# Patient Record
Sex: Female | Born: 1945
Health system: Southern US, Community
[De-identification: ages and names within clinical notes are randomized; demographics above are authoritative.]

## PROBLEM LIST (undated history)

## (undated) DIAGNOSIS — I10 Essential (primary) hypertension: Secondary | ICD-10-CM

## (undated) DIAGNOSIS — S42301A Unspecified fracture of shaft of humerus, right arm, initial encounter for closed fracture: Secondary | ICD-10-CM

## (undated) DIAGNOSIS — M1990 Unspecified osteoarthritis, unspecified site: Secondary | ICD-10-CM

## (undated) DIAGNOSIS — R339 Retention of urine, unspecified: Secondary | ICD-10-CM

## (undated) DIAGNOSIS — C801 Malignant (primary) neoplasm, unspecified: Secondary | ICD-10-CM

## (undated) HISTORY — PX: EYE SURGERY: SHX253

## (undated) HISTORY — PX: TUBAL LIGATION: SHX77

---

## 1997-09-26 ENCOUNTER — Encounter: Admission: RE | Admit: 1997-09-26 | Discharge: 1997-12-25 | Payer: Self-pay | Admitting: Family Medicine

## 1998-05-09 ENCOUNTER — Ambulatory Visit (HOSPITAL_COMMUNITY): Admission: RE | Admit: 1998-05-09 | Discharge: 1998-05-09 | Payer: Self-pay | Admitting: Specialist

## 1998-05-09 ENCOUNTER — Encounter: Payer: Self-pay | Admitting: Specialist

## 1998-05-30 ENCOUNTER — Ambulatory Visit (HOSPITAL_COMMUNITY): Admission: RE | Admit: 1998-05-30 | Discharge: 1998-05-30 | Payer: Self-pay | Admitting: Obstetrics and Gynecology

## 1999-06-13 ENCOUNTER — Other Ambulatory Visit: Admission: RE | Admit: 1999-06-13 | Discharge: 1999-06-13 | Payer: Self-pay | Admitting: Obstetrics and Gynecology

## 2000-06-02 ENCOUNTER — Other Ambulatory Visit: Admission: RE | Admit: 2000-06-02 | Discharge: 2000-06-02 | Payer: Self-pay | Admitting: Obstetrics and Gynecology

## 2001-07-13 ENCOUNTER — Other Ambulatory Visit: Admission: RE | Admit: 2001-07-13 | Discharge: 2001-07-13 | Payer: Self-pay | Admitting: Obstetrics and Gynecology

## 2010-07-02 ENCOUNTER — Other Ambulatory Visit: Payer: Self-pay | Admitting: Family Medicine

## 2010-07-02 ENCOUNTER — Other Ambulatory Visit (HOSPITAL_COMMUNITY)
Admission: RE | Admit: 2010-07-02 | Discharge: 2010-07-02 | Disposition: A | Discharge status: Home / Self Care | Payer: 59 | Source: Ambulatory Visit | Attending: Family Medicine | Admitting: Family Medicine

## 2010-07-02 DIAGNOSIS — Z1159 Encounter for screening for other viral diseases: Secondary | ICD-10-CM | POA: Insufficient documentation

## 2010-07-02 DIAGNOSIS — Z124 Encounter for screening for malignant neoplasm of cervix: Secondary | ICD-10-CM | POA: Insufficient documentation

## 2015-12-26 DIAGNOSIS — Z1231 Encounter for screening mammogram for malignant neoplasm of breast: Secondary | ICD-10-CM | POA: Diagnosis not present

## 2015-12-26 DIAGNOSIS — M8589 Other specified disorders of bone density and structure, multiple sites: Secondary | ICD-10-CM | POA: Diagnosis not present

## 2016-02-04 DIAGNOSIS — R69 Illness, unspecified: Secondary | ICD-10-CM | POA: Diagnosis not present

## 2016-02-21 DIAGNOSIS — Z23 Encounter for immunization: Secondary | ICD-10-CM | POA: Diagnosis not present

## 2016-02-21 DIAGNOSIS — Z1159 Encounter for screening for other viral diseases: Secondary | ICD-10-CM | POA: Diagnosis not present

## 2016-02-21 DIAGNOSIS — M8580 Other specified disorders of bone density and structure, unspecified site: Secondary | ICD-10-CM | POA: Diagnosis not present

## 2016-02-21 DIAGNOSIS — E6609 Other obesity due to excess calories: Secondary | ICD-10-CM | POA: Diagnosis not present

## 2016-02-21 DIAGNOSIS — E559 Vitamin D deficiency, unspecified: Secondary | ICD-10-CM | POA: Diagnosis not present

## 2016-02-21 DIAGNOSIS — Z1211 Encounter for screening for malignant neoplasm of colon: Secondary | ICD-10-CM | POA: Diagnosis not present

## 2016-02-21 DIAGNOSIS — I10 Essential (primary) hypertension: Secondary | ICD-10-CM | POA: Diagnosis not present

## 2016-02-21 DIAGNOSIS — Z6834 Body mass index (BMI) 34.0-34.9, adult: Secondary | ICD-10-CM | POA: Diagnosis not present

## 2016-02-21 DIAGNOSIS — Z0000 Encounter for general adult medical examination without abnormal findings: Secondary | ICD-10-CM | POA: Diagnosis not present

## 2016-08-07 ENCOUNTER — Telehealth (INDEPENDENT_AMBULATORY_CARE_PROVIDER_SITE_OTHER): Payer: Self-pay | Admitting: Orthopaedic Surgery

## 2016-08-07 NOTE — Telephone Encounter (Signed)
FAXED Lupus

## 2016-08-25 ENCOUNTER — Ambulatory Visit (INDEPENDENT_AMBULATORY_CARE_PROVIDER_SITE_OTHER): Payer: Medicare HMO | Admitting: Orthopaedic Surgery

## 2016-08-25 DIAGNOSIS — M25561 Pain in right knee: Secondary | ICD-10-CM

## 2016-08-25 DIAGNOSIS — S83231A Complex tear of medial meniscus, current injury, right knee, initial encounter: Secondary | ICD-10-CM | POA: Diagnosis not present

## 2016-08-25 NOTE — Progress Notes (Signed)
Office Visit Note   Patient: Lindsay Peck           Date of Birth: 12-24-1945           MRN: [id number] Visit Date: 08/25/2016              Requested by: No referring provider defined for this encounter. PCP: Tawanna Solo, MD   Assessment & Plan: Visit Diagnoses:  1. Acute pain of right knee   2. Complex tear of medial meniscus of right knee as current injury, initial encounter     Plan: I feel that she would benefit from a right knee arthroscopy with a partial medial meniscectomy. The main reason is that she is having symptomatic locking catching from this large horizontal tear of the meniscus. Also she has intact cartilage throughout the medial compartment. She is 71 years old she understands that certainly arthritic changes can be accelerated when she loosened review shock absorbing she is now having enough symptoms is bothering her. I think he would be quite some time before she would potentially increase her arthritic changes on the medial compartment and for now since she has a large joint effusion and minimal cartilage changes in that knee in spite of the large meniscal tear I do feel that a meniscal debridement/partial medial meniscectomy is warranted. I showed her knee model and went over the MRI report and described in detail what the surgery involves including a thorough discussion of risk medicine surgery. She does wish proceed with this. She will stability active this summer and has a trip this fall planned to [place]. We would see her back at one week postoperative rehabilitation her knee after that. All questions were encouraged and answered. Follow-Up Instructions: Return for 1 week post-op.   Orders:  No orders of the defined types were placed in this encounter.  No orders of the defined types were placed in this encounter.     Procedures: No procedures performed   Clinical Data: No additional findings.   Subjective: No chief complaint on file. The  patient comes in today for second opinion regarding her right knee. She's been seen at [facility] and has an MRI report showing an acute horizontal tear of the medial meniscus. She 71 year old who is very active. According to the patient she was told she should just watch this because if she had a meniscectomy it would likely lead to severe arthritic changes and she would need a knee replacement and less than year. Again this according to the patient. Certainly I explained to her that losing cervical meniscus does increased arthritic changes of the knee. She does report locking catching in her knee and medial joint line tenderness as well as swelling in her knee. She said this is detrimentally affected her activities daily living, her quality of life, and her mobility. She is seeking out a second opinion in regards to her knee. She would like to consider arthroscopic intervention.  HPI  Review of Systems She currently denies any chest pain, headache, nausea, vomiting, fever, chills.  Objective: Vital Signs: There were no vitals taken for this visit.  Physical Exam She is alert and oriented 3 in no acute distress. She is walking without assistive device. Ortho Exam Examination of her right knee shows a moderate effusion. She has medial joint line tenderness as well as a positive worse on the medial side. She has significant pain in her knee once I flexor past 90 of flexion. The knee itself feels  ligamentously stable. There is some slight patellofemoral crepitation. Specialty Comments:  No specialty comments available.  Imaging: No results found. The MRI confirms a large horizontal medial meniscal tear with intact cartilage in the medial compartment of her knee. There is a moderate effusion as well.  PMFS History: Patient Active Problem List   Diagnosis Date Noted  . Acute pain of right knee 08/25/2016  . Complex tear of medial meniscus of right knee as current injury 08/25/2016     No past medical history on file.  No family history on file.  No past surgical history on file. Social History   Occupational History  . Not on file.   Social History Main Topics  . Smoking status: Not on file  . Smokeless tobacco: Not on file  . Alcohol use Not on file  . Drug use: Unknown  . Sexual activity: Not on file

## 2016-09-11 DIAGNOSIS — S83241A Other tear of medial meniscus, current injury, right knee, initial encounter: Secondary | ICD-10-CM | POA: Diagnosis not present

## 2016-09-18 ENCOUNTER — Ambulatory Visit (INDEPENDENT_AMBULATORY_CARE_PROVIDER_SITE_OTHER): Payer: Medicare HMO | Admitting: Physician Assistant

## 2016-09-18 ENCOUNTER — Encounter (INDEPENDENT_AMBULATORY_CARE_PROVIDER_SITE_OTHER): Payer: Self-pay | Admitting: Physician Assistant

## 2016-09-18 DIAGNOSIS — Z9889 Other specified postprocedural states: Secondary | ICD-10-CM

## 2016-09-18 NOTE — Progress Notes (Signed)
Mrs. Hunkele returns today status post right knee arthroscopy with medial partial meniscectomy and debridement. Knee arthroscopy showed significant medial femoral condyle and tibial plateau arthritis with multiple areas of grade 4 changes femoral condyle. She overall is doing well states she's having no real pain. She's having no mechanical symptoms in the knee.  Physical exam: General well-developed well-nourished female in no acute distress. Right knee port sites well possibly with interrupted nylon sutures. Calf supple nontender she has full extension and flexion to approximately 105 110.  Plan: Sutures removed. Scar tissue mobilization over the port sites encouraged. We'll see her back in a month check progress lack of. She may benefit from a supplemental injection in her knee if she has considerable pain. She did like to stay away from NSAIDs and take natural anti-inflammatories. Discussed dried cherries and Tumeric with her.

## 2016-09-24 ENCOUNTER — Ambulatory Visit (INDEPENDENT_AMBULATORY_CARE_PROVIDER_SITE_OTHER): Payer: Medicare HMO | Admitting: Orthopaedic Surgery

## 2016-09-25 ENCOUNTER — Inpatient Hospital Stay (INDEPENDENT_AMBULATORY_CARE_PROVIDER_SITE_OTHER): Payer: Medicare HMO | Admitting: Orthopaedic Surgery

## 2016-10-20 ENCOUNTER — Ambulatory Visit (INDEPENDENT_AMBULATORY_CARE_PROVIDER_SITE_OTHER): Payer: Medicare HMO | Admitting: Physician Assistant

## 2016-10-22 ENCOUNTER — Ambulatory Visit (INDEPENDENT_AMBULATORY_CARE_PROVIDER_SITE_OTHER): Payer: Medicare HMO | Admitting: Orthopaedic Surgery

## 2016-10-22 DIAGNOSIS — S83231D Complex tear of medial meniscus, current injury, right knee, subsequent encounter: Secondary | ICD-10-CM

## 2016-10-22 DIAGNOSIS — Z9889 Other specified postprocedural states: Secondary | ICD-10-CM

## 2016-10-22 NOTE — Progress Notes (Signed)
The patient is now 6 weeks status post a right knee arthroscopy with a partial medial meniscectomy. We did find grade 4 cartilage changes in the medial femoral condyle. She says her pain now is only about 1 out of 10. She said her strength and range of motion are increasing and she is going to therapy for her knee as well in terms of personal trainer. She is getting stronger.  On examination is no effusion of her right knee. She has excellent range of motion of the knee with minimal discomfort tenderness. Her incisions are well-healed. Her quad strength is good.  At this point she'll follow-up as needed. All questions were encouraged and answered. Also recommended she try Tumeric. I talked her about trying a hyaluronic acid injection or another steroid injection in the future if needed.

## 2016-11-04 ENCOUNTER — Other Ambulatory Visit (INDEPENDENT_AMBULATORY_CARE_PROVIDER_SITE_OTHER): Payer: Self-pay | Admitting: Orthopaedic Surgery

## 2019-05-09 ENCOUNTER — Encounter: Payer: Self-pay | Admitting: Orthopaedic Surgery

## 2019-05-09 ENCOUNTER — Ambulatory Visit: Payer: Medicare HMO | Admitting: Orthopaedic Surgery

## 2019-05-09 ENCOUNTER — Other Ambulatory Visit: Payer: Self-pay

## 2019-05-09 ENCOUNTER — Ambulatory Visit (INDEPENDENT_AMBULATORY_CARE_PROVIDER_SITE_OTHER): Payer: Medicare HMO

## 2019-05-09 DIAGNOSIS — M25571 Pain in right ankle and joints of right foot: Secondary | ICD-10-CM

## 2019-05-09 MED ORDER — MELOXICAM 15 MG PO TABS: 15.0000 mg | ORAL_TABLET | Freq: Every day | ORAL | 1 refills | Status: DC

## 2019-05-09 NOTE — Progress Notes (Signed)
Office Visit Note   Patient: Lindsay Peck           Date of Birth: 12/23/1945           MRN: [id number] Visit Date: 05/09/2019              Requested by: Kathyrn Lass, [place],  [place] [id number] PCP: Kathyrn Lass, MD   Assessment & Plan: Visit Diagnoses:  1. Pain in right ankle and joints of right foot     Plan: I gave her reassurance that I do not see any fracture.  I would like her to try an ASO for the next 3 to 4 weeks.  Also going to try meloxicam and topical Voltaren gel on her ankle.  All question concerns were answered addressed.  Follow-up can be as needed.  However after 3 weeks if she still having pain she will call us because a MRI would be warranted of the right ankle to rule out a stress fracture.  Follow-Up Instructions: Return if symptoms worsen or fail to improve.   Orders:  Orders Placed This Encounter  Procedures  . XR Ankle Complete Right   Meds ordered this encounter  Medications  . meloxicam (MOBIC) 15 MG tablet    Sig: Take 1 tablet (15 mg total) by mouth daily.    Dispense:  30 tablet    Refill:  1      Procedures: No procedures performed   Clinical Data: No additional findings.   Subjective: Chief Complaint  Patient presents with  . Right Ankle - Pain  The patient comes in with chief complaint of right ankle pain for about 3 weeks.  There is been no new injury.  She is 74 years old and does have a history of fractures and osteopenia.  She says her range of motion is painful and she has decreased strength.  She has been taking calcium.  She points to the front of the ankle as a source of her pain.  She says it hurts with pivoting activities as well.  She denies any numbness and tingling.  HPI  Review of Systems She currently denies any acute change in medical status.  She denies any headache, chest pain, shortness of breath, fever, chills, nausea, vomiting  Objective: Vital Signs: There were no vitals taken for  this visit.  Physical Exam She is alert and orient x3 and in no acute distress Ortho Exam Examination of her right ankle shows pain with dorsiflexion plantarflexion across the front of her ankle but otherwise her ankle exam is normal.  The range of motion is full.  Her arch feels normal.  There is no significant joint effusion at the ankle.  She has palpable pulses in her foot and normal sensation across the foot. Specialty Comments:  No specialty comments available.  Imaging: XR Ankle Complete Right  Result Date: 05/09/2019 3 views of the right ankle show no acute findings.  The joint space is well-maintained.  There is no evidence of fracture.    PMFS History: Patient Active Problem List   Diagnosis Date Noted  . Status post arthroscopy of right knee 10/22/2016  . Acute pain of right knee 08/25/2016  . Complex tear of medial meniscus of right knee as current injury 08/25/2016   History reviewed. No pertinent past medical history.  History reviewed. No pertinent family history.  History reviewed. No pertinent surgical history. Social History   Occupational History  . Not on file  Tobacco Use  . Smoking status: Never Smoker  . Smokeless tobacco: Never Used  Substance and Sexual Activity  . Alcohol use: Not on file  . Drug use: Not on file  . Sexual activity: Not on file

## 2019-05-22 ENCOUNTER — Ambulatory Visit: Payer: Medicare HMO | Attending: Internal Medicine

## 2019-05-22 DIAGNOSIS — Z23 Encounter for immunization: Secondary | ICD-10-CM | POA: Insufficient documentation

## 2019-05-27 ENCOUNTER — Other Ambulatory Visit: Payer: Self-pay | Admitting: Urology

## 2019-05-28 ENCOUNTER — Ambulatory Visit: Payer: Medicare HMO

## 2019-06-02 ENCOUNTER — Ambulatory Visit: Payer: Medicare HMO

## 2019-06-08 ENCOUNTER — Ambulatory Visit: Payer: Medicare HMO

## 2019-06-19 ENCOUNTER — Ambulatory Visit: Payer: Medicare HMO | Attending: Internal Medicine

## 2019-06-19 DIAGNOSIS — Z23 Encounter for immunization: Secondary | ICD-10-CM | POA: Insufficient documentation

## 2019-06-19 NOTE — Progress Notes (Signed)
   Covid-19 Vaccination Clinic  Name:  Lindsay Peck    MRN: [id number] DOB: July 05, 1945  06/19/2019  [name] was observed post Covid-19 immunization for 15 minutes without incidence. She was provided with Vaccine Information Sheet and instruction to access the V-Safe system.   [name] was instructed to call 911 with any severe reactions post vaccine: Marland Kitchen Difficulty breathing  . Swelling of your face and throat  . A fast heartbeat  . A bad rash all over your body  . Dizziness and weakness    Immunizations Administered    Name Date Dose VIS Date Route   Moderna COVID-19 Vaccine 06/19/2019  5:03 PM 0.5 mL 03/29/2019 Intramuscular   Manufacturer: Moderna   Lot: [id number]   [place]:[id number]

## 2019-07-07 NOTE — Patient Instructions (Addendum)
DUE TO COVID-19 ONLY ONE VISITOR IS ALLOWED TO COME WITH YOU AND STAY IN THE WAITING ROOM ONLY DURING PRE OP AND PROCEDURE DAY OF SURGERY. THE 1 VISITOR MAY VISIT WITH YOU AFTER SURGERY IN YOUR PRIVATE ROOM DURING VISITING HOURS ONLY!  YOU NEED TO HAVE A COVID 19 TEST ON 07-12-19 @ 2:10 PM, THIS TEST MUST BE DONE BEFORE SURGERY, COME  [place], [place] [address] , [address].  ([place]) ONCE YOUR COVID TEST IS COMPLETED, PLEASE BEGIN THE QUARANTINE INSTRUCTIONS AS OUTLINED IN YOUR HANDOUT.                ISABELE FAM  07/07/2019   Your procedure is scheduled on: 07-15-19   Report to [facility] Main  Entrance    Report to Admitting at 5:30 AM     Call this number if you have problems the morning of surgery [phone]    Remember: Do not eat food or drink liquids :After Midnight.     Take these medicines the morning of surgery with A SIP OF WATER: Atorvastatin    BRUSH YOUR TEETH MORNING OF SURGERY AND RINSE YOUR MOUTH OUT, NO CHEWING GUM CANDY OR MINTS.                                You may not have any metal on your body including hair pins and              piercings     Do not wear jewelry, make-up, lotions, powders or perfumes, deodorant              Do not wear nail polish on your fingernails.  Do not shave  48 hours prior to surgery.         Do not bring valuables to the hospital. [place].  Contacts, dentures or bridgework may not be worn into surgery.  You may bring an overnight bag     Special Instructions: N/A              Please read over the following fact sheets you were given: _____________________________________________________________________             [facility] - Preparing for Surgery Before surgery, you can play an important role.  Because skin is not sterile, your skin needs to be as free of germs as possible.  You can reduce the number of germs on your skin by  washing with CHG (chlorahexidine gluconate) soap before surgery.  CHG is an antiseptic cleaner which kills germs and bonds with the skin to continue killing germs even after washing. Please DO NOT use if you have an allergy to CHG or antibacterial soaps.  If your skin becomes reddened/irritated stop using the CHG and inform your nurse when you arrive at Short Stay. Do not shave (including legs and underarms) for at least 48 hours prior to the first CHG shower.  You may shave your face/neck. Please follow these instructions carefully:  1.  Shower with CHG Soap the night before surgery and the  morning of Surgery.  2.  If you choose to wash your hair, wash your hair first as usual with your  normal  shampoo.  3.  After you shampoo, rinse your hair and body thoroughly to remove the  shampoo.  4.  Use CHG as you would any other liquid soap.  You can apply chg directly  to the skin and wash                       Gently with a scrungie or clean washcloth.  5.  Apply the CHG Soap to your body ONLY FROM THE NECK DOWN.   Do not use on face/ open                           Wound or open sores. Avoid contact with eyes, ears mouth and genitals (private parts).                       Wash face,  Genitals (private parts) with your normal soap.             6.  Wash thoroughly, paying special attention to the area where your surgery  will be performed.  7.  Thoroughly rinse your body with warm water from the neck down.  8.  DO NOT shower/wash with your normal soap after using and rinsing off  the CHG Soap.                9.  Pat yourself dry with a clean towel.            10.  Wear clean pajamas.            11.  Place clean sheets on your bed the night of your first shower and do not  sleep with pets. Day of Surgery : Do not apply any lotions/deodorants the morning of surgery.  Please wear clean clothes to the hospital/surgery center.  FAILURE TO FOLLOW THESE INSTRUCTIONS MAY RESULT IN THE  CANCELLATION OF YOUR SURGERY PATIENT SIGNATURE_________________________________  NURSE SIGNATURE__________________________________  ________________________________________________________________________

## 2019-07-07 NOTE — Progress Notes (Signed)
PCP - Kathyrn Lass, MD Cardiologist -   Chest x-ray -  EKG -  Stress Test -  ECHO -  Cardiac Cath -   Sleep Study -  CPAP -   Fasting Blood Sugar -  Checks Blood Sugar _____ times a day  Blood Thinner Instructions: Aspirin Instructions: Last Dose:  Anesthesia review:   Patient denies shortness of breath, fever, cough and chest pain at PAT appointment   Patient verbalized understanding of instructions that were given to them at the PAT appointment. Patient was also instructed that they will need to review over the PAT instructions again at home before surgery.

## 2019-07-08 ENCOUNTER — Encounter (HOSPITAL_COMMUNITY)
Admission: RE | Admit: 2019-07-08 | Discharge: 2019-07-08 | Disposition: A | Discharge status: Home / Self Care | Payer: Medicare HMO | Source: Ambulatory Visit | Attending: Urology | Admitting: Urology

## 2019-07-08 ENCOUNTER — Encounter (HOSPITAL_COMMUNITY): Payer: Self-pay | Admitting: *Deleted

## 2019-07-08 ENCOUNTER — Other Ambulatory Visit: Payer: Self-pay

## 2019-07-08 HISTORY — DX: Malignant (primary) neoplasm, unspecified: C80.1

## 2019-07-08 HISTORY — DX: Essential (primary) hypertension: I10

## 2019-07-08 HISTORY — DX: Unspecified osteoarthritis, unspecified site: M19.90

## 2019-07-11 ENCOUNTER — Other Ambulatory Visit: Payer: Self-pay

## 2019-07-11 ENCOUNTER — Encounter (HOSPITAL_COMMUNITY)
Admission: RE | Admit: 2019-07-11 | Discharge: 2019-07-11 | Disposition: A | Discharge status: Home / Self Care | Payer: Medicare HMO | Source: Ambulatory Visit | Attending: Urology | Admitting: Urology

## 2019-07-11 DIAGNOSIS — Z01818 Encounter for other preprocedural examination: Secondary | ICD-10-CM | POA: Diagnosis present

## 2019-07-11 DIAGNOSIS — I10 Essential (primary) hypertension: Secondary | ICD-10-CM | POA: Insufficient documentation

## 2019-07-11 LAB — COMPREHENSIVE METABOLIC PANEL
ALT: 16 U/L (ref 0–44)
AST: 19 U/L (ref 15–41)
Albumin: 3.8 g/dL (ref 3.5–5.0)
Alkaline Phosphatase: 56 U/L (ref 38–126)
Anion gap: 9 (ref 5–15)
BUN: 21 mg/dL (ref 8–23)
CO2: 28 mmol/L (ref 22–32)
Calcium: 9.3 mg/dL (ref 8.9–10.3)
Chloride: 104 mmol/L (ref 98–111)
Creatinine, Ser: 0.57 mg/dL (ref 0.44–1.00)
GFR calc Af Amer: >60 mL/min (ref >60)
GFR calc non Af Amer: >60 mL/min (ref >60)
Glucose, Bld: 85 mg/dL (ref 70–99)
Potassium: 4 mmol/L (ref 3.5–5.1)
Sodium: 141 mmol/L (ref 135–145)
Total Bilirubin: 0.5 mg/dL (ref 0.3–1.2)
Total Protein: 6.8 g/dL (ref 6.5–8.1)

## 2019-07-11 LAB — CBC
HCT: 42 % (ref 36.0–46.0)
Hemoglobin: 13.2 g/dL (ref 12.0–15.0)
MCH: 28.7 pg (ref 26.0–34.0)
MCHC: 31.4 g/dL (ref 30.0–36.0)
MCV: 91.3 fL (ref 80.0–100.0)
Platelets: 280 10*3/uL (ref 150–400)
RBC: 4.6 MIL/uL (ref 3.87–5.11)
RDW: 13.4 % (ref 11.5–15.5)
WBC: 6.3 10*3/uL (ref 4.0–10.5)
nRBC: 0 % (ref 0.0–0.2)

## 2019-07-11 LAB — ABO/RH: ABO/RH(D): AB POS

## 2019-07-12 ENCOUNTER — Other Ambulatory Visit (HOSPITAL_COMMUNITY)
Admission: RE | Admit: 2019-07-12 | Discharge: 2019-07-12 | Disposition: A | Discharge status: Home / Self Care | Payer: Medicare HMO | Source: Ambulatory Visit | Attending: Urology | Admitting: Urology

## 2019-07-12 DIAGNOSIS — Z20822 Contact with and (suspected) exposure to covid-19: Secondary | ICD-10-CM | POA: Diagnosis not present

## 2019-07-12 DIAGNOSIS — Z01812 Encounter for preprocedural laboratory examination: Secondary | ICD-10-CM | POA: Insufficient documentation

## 2019-07-12 LAB — URINE CULTURE: Culture: NO GROWTH

## 2019-07-12 LAB — SARS CORONAVIRUS 2 (TAT 6-24 HRS): SARS Coronavirus 2: NEGATIVE

## 2019-07-15 ENCOUNTER — Other Ambulatory Visit: Payer: Self-pay

## 2019-07-15 ENCOUNTER — Ambulatory Visit (HOSPITAL_COMMUNITY): Payer: Medicare HMO | Admitting: Anesthesiology

## 2019-07-15 ENCOUNTER — Encounter (HOSPITAL_COMMUNITY): Payer: Self-pay | Admitting: Urology

## 2019-07-15 ENCOUNTER — Observation Stay (HOSPITAL_COMMUNITY)
Admission: RE | Admit: 2019-07-15 | Discharge: 2019-07-16 | Disposition: A | Discharge status: Home / Self Care | Payer: Medicare HMO | Attending: Urology | Admitting: Urology

## 2019-07-15 ENCOUNTER — Ambulatory Visit (HOSPITAL_COMMUNITY): Payer: Medicare HMO | Admitting: Physician Assistant

## 2019-07-15 ENCOUNTER — Encounter (HOSPITAL_COMMUNITY)
Admission: RE | Disposition: A | Discharge status: Home / Self Care | Payer: Self-pay | Source: Home / Self Care | Attending: Urology

## 2019-07-15 DIAGNOSIS — M1990 Unspecified osteoarthritis, unspecified site: Secondary | ICD-10-CM | POA: Diagnosis not present

## 2019-07-15 DIAGNOSIS — Z7982 Long term (current) use of aspirin: Secondary | ICD-10-CM | POA: Diagnosis not present

## 2019-07-15 DIAGNOSIS — Z880 Allergy status to penicillin: Secondary | ICD-10-CM | POA: Diagnosis not present

## 2019-07-15 DIAGNOSIS — Z79899 Other long term (current) drug therapy: Secondary | ICD-10-CM | POA: Insufficient documentation

## 2019-07-15 DIAGNOSIS — N8110 Cystocele, unspecified: Secondary | ICD-10-CM | POA: Diagnosis not present

## 2019-07-15 DIAGNOSIS — N8189 Other female genital prolapse: Secondary | ICD-10-CM | POA: Diagnosis not present

## 2019-07-15 DIAGNOSIS — N814 Uterovaginal prolapse, unspecified: Secondary | ICD-10-CM | POA: Diagnosis present

## 2019-07-15 DIAGNOSIS — Z882 Allergy status to sulfonamides status: Secondary | ICD-10-CM | POA: Diagnosis not present

## 2019-07-15 DIAGNOSIS — I10 Essential (primary) hypertension: Secondary | ICD-10-CM | POA: Insufficient documentation

## 2019-07-15 DIAGNOSIS — Z885 Allergy status to narcotic agent status: Secondary | ICD-10-CM | POA: Diagnosis not present

## 2019-07-15 DIAGNOSIS — N3946 Mixed incontinence: Secondary | ICD-10-CM | POA: Diagnosis not present

## 2019-07-15 HISTORY — PX: ROBOTIC ASSISTED LAPAROSCOPIC SACROCOLPOPEXY: SHX5388

## 2019-07-15 HISTORY — PX: PUBOVAGINAL SLING: SHX1035

## 2019-07-15 LAB — HEMOGLOBIN AND HEMATOCRIT, BLOOD
HCT: 42 % (ref 36.0–46.0)
Hemoglobin: 13.4 g/dL (ref 12.0–15.0)

## 2019-07-15 LAB — TYPE AND SCREEN
ABO/RH(D): AB POS
Antibody Screen: NEGATIVE

## 2019-07-15 SURGERY — SACROCOLPOPEXY, ROBOT-ASSISTED, LAPAROSCOPIC
Anesthesia: General

## 2019-07-15 MED ORDER — CLINDAMYCIN PHOSPHATE 2 % VA CREA
TOPICAL_CREAM | VAGINAL | Status: DC | PRN
  Administered 2019-07-15: 1 via VAGINAL

## 2019-07-15 MED ORDER — DOCUSATE SODIUM 100 MG PO CAPS
100.0000 mg | ORAL_CAPSULE | Freq: Two times a day (BID) | ORAL | Status: DC
  Administered 2019-07-15 – 2019-07-16 (×2): 100 mg via ORAL
  Filled 2019-07-15 (×2): qty 1

## 2019-07-15 MED ORDER — CIPROFLOXACIN HCL 500 MG PO TABS: 500.0000 mg | ORAL_TABLET | Freq: Two times a day (BID) | ORAL | 0 refills | Status: AC

## 2019-07-15 MED ORDER — CIPROFLOXACIN IN D5W 200 MG/100ML IV SOLN
200.0000 mg | Freq: Two times a day (BID) | INTRAVENOUS | Status: AC
  Administered 2019-07-15: 200 mg via INTRAVENOUS
  Filled 2019-07-15: qty 100

## 2019-07-15 MED ORDER — BUPIVACAINE LIPOSOME 1.3 % IJ SUSP
20.0000 mL | Freq: Once | INTRAMUSCULAR | Status: AC
  Administered 2019-07-15: 20 mL
  Filled 2019-07-15: qty 20

## 2019-07-15 MED ORDER — ONDANSETRON HCL 4 MG/2ML IJ SOLN
INTRAMUSCULAR | Status: DC | PRN
  Administered 2019-07-15: 4 mg via INTRAVENOUS

## 2019-07-15 MED ORDER — LIDOCAINE HCL 2 % IJ SOLN
INTRAMUSCULAR | Status: AC
  Filled 2019-07-15: qty 20

## 2019-07-15 MED ORDER — CEFAZOLIN SODIUM-DEXTROSE 2-4 GM/100ML-% IV SOLN
INTRAVENOUS | Status: AC
  Filled 2019-07-15: qty 100

## 2019-07-15 MED ORDER — BELLADONNA ALKALOIDS-OPIUM 16.2-60 MG RE SUPP: 1.0000 | Freq: Four times a day (QID) | RECTAL | Status: DC | PRN

## 2019-07-15 MED ORDER — DIPHENHYDRAMINE HCL 12.5 MG/5ML PO ELIX: 12.5000 mg | ORAL_SOLUTION | Freq: Four times a day (QID) | ORAL | Status: DC | PRN

## 2019-07-15 MED ORDER — KETAMINE HCL 10 MG/ML IJ SOLN
INTRAMUSCULAR | Status: AC
  Filled 2019-07-15: qty 1

## 2019-07-15 MED ORDER — SODIUM CHLORIDE 0.9 % IV SOLN
INTRAVENOUS | Status: DC | PRN
  Administered 2019-07-15: 500 mL

## 2019-07-15 MED ORDER — SODIUM CHLORIDE (PF) 0.9 % IJ SOLN
INTRAMUSCULAR | Status: AC
  Filled 2019-07-15: qty 20

## 2019-07-15 MED ORDER — DEXTROSE-NACL 5-0.45 % IV SOLN: INTRAVENOUS | Status: DC

## 2019-07-15 MED ORDER — CHLORHEXIDINE GLUCONATE CLOTH 2 % EX PADS
6.0000 | MEDICATED_PAD | Freq: Every day | CUTANEOUS | Status: DC
  Administered 2019-07-16: 6 via TOPICAL

## 2019-07-15 MED ORDER — DOCUSATE SODIUM 100 MG PO CAPS: 100.0000 mg | ORAL_CAPSULE | Freq: Every day | ORAL | Status: DC | PRN

## 2019-07-15 MED ORDER — ATORVASTATIN CALCIUM 10 MG PO TABS
10.0000 mg | ORAL_TABLET | Freq: Every day | ORAL | Status: DC
  Administered 2019-07-15 – 2019-07-16 (×2): 10 mg via ORAL
  Filled 2019-07-15 (×2): qty 1

## 2019-07-15 MED ORDER — CEFAZOLIN SODIUM-DEXTROSE 2-4 GM/100ML-% IV SOLN
2.0000 g | INTRAVENOUS | Status: AC
  Administered 2019-07-15: 08:00:00 2 g via INTRAVENOUS

## 2019-07-15 MED ORDER — ROCURONIUM BROMIDE 10 MG/ML (PF) SYRINGE
PREFILLED_SYRINGE | INTRAVENOUS | Status: AC
  Filled 2019-07-15: qty 10

## 2019-07-15 MED ORDER — HYDROCHLOROTHIAZIDE 25 MG PO TABS
25.0000 mg | ORAL_TABLET | Freq: Every day | ORAL | Status: DC
  Administered 2019-07-15 – 2019-07-16 (×2): 25 mg via ORAL
  Filled 2019-07-15 (×2): qty 1

## 2019-07-15 MED ORDER — BUPIVACAINE HCL (PF) 0.5 % IJ SOLN
INTRAMUSCULAR | Status: AC
  Filled 2019-07-15: qty 30

## 2019-07-15 MED ORDER — STERILE WATER FOR IRRIGATION IR SOLN
Status: DC | PRN
  Administered 2019-07-15: 1000 mL

## 2019-07-15 MED ORDER — ESTRADIOL 0.1 MG/GM VA CREA: 1.0000 | TOPICAL_CREAM | VAGINAL | 12 refills | Status: AC

## 2019-07-15 MED ORDER — FENTANYL CITRATE (PF) 100 MCG/2ML IJ SOLN: 25.0000 ug | INTRAMUSCULAR | Status: DC | PRN

## 2019-07-15 MED ORDER — HYDROMORPHONE HCL 1 MG/ML IJ SOLN: 0.5000 mg | INTRAMUSCULAR | Status: DC | PRN

## 2019-07-15 MED ORDER — ONDANSETRON HCL 4 MG/2ML IJ SOLN
4.0000 mg | INTRAMUSCULAR | Status: DC | PRN
  Administered 2019-07-15: 4 mg via INTRAVENOUS
  Filled 2019-07-15: qty 2

## 2019-07-15 MED ORDER — KETOROLAC TROMETHAMINE 15 MG/ML IJ SOLN
INTRAMUSCULAR | Status: AC
  Filled 2019-07-15: qty 1

## 2019-07-15 MED ORDER — ACETAMINOPHEN 500 MG PO TABS
1000.0000 mg | ORAL_TABLET | Freq: Once | ORAL | Status: AC
  Administered 2019-07-15: 1000 mg via ORAL
  Filled 2019-07-15: qty 2

## 2019-07-15 MED ORDER — EPHEDRINE SULFATE-NACL 50-0.9 MG/10ML-% IV SOSY
PREFILLED_SYRINGE | INTRAVENOUS | Status: DC | PRN
  Administered 2019-07-15: 5 mg via INTRAVENOUS
  Administered 2019-07-15 (×3): 10 mg via INTRAVENOUS

## 2019-07-15 MED ORDER — SUGAMMADEX SODIUM 200 MG/2ML IV SOLN
INTRAVENOUS | Status: DC | PRN
  Administered 2019-07-15: 150 mg via INTRAVENOUS

## 2019-07-15 MED ORDER — LACTATED RINGERS IV SOLN: INTRAVENOUS | Status: DC | PRN

## 2019-07-15 MED ORDER — SODIUM CHLORIDE 0.9 % IV SOLN
INTRAVENOUS | Status: AC
  Filled 2019-07-15: qty 500000

## 2019-07-15 MED ORDER — CLINDAMYCIN PHOSPHATE 600 MG/50ML IV SOLN: 600.0000 mg | INTRAVENOUS | Status: DC

## 2019-07-15 MED ORDER — ACETAMINOPHEN 10 MG/ML IV SOLN
1000.0000 mg | Freq: Four times a day (QID) | INTRAVENOUS | Status: DC
  Administered 2019-07-15 – 2019-07-16 (×3): 1000 mg via INTRAVENOUS
  Filled 2019-07-15 (×4): qty 100

## 2019-07-15 MED ORDER — LIDOCAINE HCL (CARDIAC) PF 100 MG/5ML IV SOSY
PREFILLED_SYRINGE | INTRAVENOUS | Status: DC | PRN
  Administered 2019-07-15: 60 mg via INTRAVENOUS

## 2019-07-15 MED ORDER — DEXAMETHASONE SODIUM PHOSPHATE 10 MG/ML IJ SOLN
INTRAMUSCULAR | Status: DC | PRN
  Administered 2019-07-15: 10 mg via INTRAVENOUS

## 2019-07-15 MED ORDER — KETOROLAC TROMETHAMINE 15 MG/ML IJ SOLN
15.0000 mg | Freq: Four times a day (QID) | INTRAMUSCULAR | Status: DC
  Administered 2019-07-15 – 2019-07-16 (×4): 15 mg via INTRAVENOUS
  Filled 2019-07-15 (×3): qty 1

## 2019-07-15 MED ORDER — PROPOFOL 10 MG/ML IV BOLUS
INTRAVENOUS | Status: DC | PRN
  Administered 2019-07-15: 120 mg via INTRAVENOUS

## 2019-07-15 MED ORDER — ROCURONIUM BROMIDE 100 MG/10ML IV SOLN
INTRAVENOUS | Status: DC | PRN
  Administered 2019-07-15 (×2): 20 mg via INTRAVENOUS
  Administered 2019-07-15: 70 mg via INTRAVENOUS
  Administered 2019-07-15: 20 mg via INTRAVENOUS

## 2019-07-15 MED ORDER — FENTANYL CITRATE (PF) 250 MCG/5ML IJ SOLN
INTRAMUSCULAR | Status: AC
  Filled 2019-07-15: qty 5

## 2019-07-15 MED ORDER — BUPIVACAINE HCL (PF) 0.5 % IJ SOLN
INTRAMUSCULAR | Status: DC | PRN
  Administered 2019-07-15: 18 mL

## 2019-07-15 MED ORDER — LIDOCAINE-EPINEPHRINE 1 %-1:100000 IJ SOLN
INTRAMUSCULAR | Status: AC
  Filled 2019-07-15: qty 1

## 2019-07-15 MED ORDER — DIPHENHYDRAMINE HCL 50 MG/ML IJ SOLN: 12.5000 mg | Freq: Four times a day (QID) | INTRAMUSCULAR | Status: DC | PRN

## 2019-07-15 MED ORDER — FENTANYL CITRATE (PF) 100 MCG/2ML IJ SOLN
INTRAMUSCULAR | Status: DC | PRN
  Administered 2019-07-15: 100 ug via INTRAVENOUS
  Administered 2019-07-15 (×3): 50 ug via INTRAVENOUS

## 2019-07-15 MED ORDER — LACTATED RINGERS IV SOLN: INTRAVENOUS | Status: DC

## 2019-07-15 MED ORDER — KETAMINE HCL 50 MG/ML IJ SOLN
INTRAMUSCULAR | Status: DC | PRN
  Administered 2019-07-15: 10 mg via INTRAMUSCULAR
  Administered 2019-07-15: 40 mg via INTRAMUSCULAR
  Administered 2019-07-15: 10 mg via INTRAMUSCULAR

## 2019-07-15 MED ORDER — MIDAZOLAM HCL 5 MG/5ML IJ SOLN
INTRAMUSCULAR | Status: DC | PRN
  Administered 2019-07-15: 2 mg via INTRAVENOUS

## 2019-07-15 MED ORDER — LIDOCAINE 20MG/ML (2%) 15 ML SYRINGE OPTIME
INTRAMUSCULAR | Status: DC | PRN
  Administered 2019-07-15: 1.5 mg/kg/h via INTRAVENOUS

## 2019-07-15 MED ORDER — PROPOFOL 10 MG/ML IV BOLUS
INTRAVENOUS | Status: AC
  Filled 2019-07-15: qty 20

## 2019-07-15 MED ORDER — CIPROFLOXACIN IN D5W 400 MG/200ML IV SOLN
400.0000 mg | INTRAVENOUS | Status: AC
  Administered 2019-07-15: 08:00:00 400 mg via INTRAVENOUS
  Filled 2019-07-15: qty 200

## 2019-07-15 MED ORDER — MIDAZOLAM HCL 2 MG/2ML IJ SOLN
INTRAMUSCULAR | Status: AC
  Filled 2019-07-15: qty 2

## 2019-07-15 MED ORDER — LIDOCAINE 2% (20 MG/ML) 5 ML SYRINGE
INTRAMUSCULAR | Status: AC
  Filled 2019-07-15: qty 5

## 2019-07-15 MED ORDER — TRAMADOL HCL 50 MG PO TABS: 50.0000 mg | ORAL_TABLET | Freq: Four times a day (QID) | ORAL | Status: DC | PRN

## 2019-07-15 MED ORDER — TRAMADOL HCL 50 MG PO TABS: 50.0000 mg | ORAL_TABLET | Freq: Four times a day (QID) | ORAL | 0 refills | Status: AC | PRN

## 2019-07-15 MED ORDER — ESTRADIOL 0.1 MG/GM VA CREA
TOPICAL_CREAM | VAGINAL | Status: AC
  Filled 2019-07-15: qty 42.5

## 2019-07-15 MED ORDER — PHENYLEPHRINE 40 MCG/ML (10ML) SYRINGE FOR IV PUSH (FOR BLOOD PRESSURE SUPPORT)
PREFILLED_SYRINGE | INTRAVENOUS | Status: DC | PRN
  Administered 2019-07-15: 120 ug via INTRAVENOUS

## 2019-07-15 MED ORDER — ONDANSETRON HCL 4 MG/2ML IJ SOLN
INTRAMUSCULAR | Status: AC
  Filled 2019-07-15: qty 2

## 2019-07-15 MED ORDER — LACTATED RINGERS IR SOLN
Status: DC | PRN
  Administered 2019-07-15: 1000 mL

## 2019-07-15 MED ORDER — DEXAMETHASONE SODIUM PHOSPHATE 10 MG/ML IJ SOLN
INTRAMUSCULAR | Status: AC
  Filled 2019-07-15: qty 1

## 2019-07-15 SURGICAL SUPPLY — 97 items
ADH SKN CLS APL DERMABOND .7 (GAUZE/BANDAGES/DRESSINGS) ×1
APL PRP STRL LF DISP 70% ISPRP (MISCELLANEOUS) ×1
BAG DRN RND TRDRP ANRFLXCHMBR (UROLOGICAL SUPPLIES)
BAG SPEC RTRVL LRG 6X4 10 (ENDOMECHANICALS) ×1
BAG URINE DRAIN 2000ML AR STRL (UROLOGICAL SUPPLIES) IMPLANT
BLADE CLIPPER SURG (BLADE) ×2 IMPLANT
BLADE HEX COATED 2.75 (ELECTRODE) ×3 IMPLANT
BLADE SURG 15 STRL LF DISP TIS (BLADE) ×2 IMPLANT
BLADE SURG 15 STRL SS (BLADE) ×6
CATH FOLEY 2WAY 5CC 16FR (CATHETERS) ×3
CATH FOLEY 2WAY SLVR  5CC 16FR (CATHETERS) ×3
CATH FOLEY 2WAY SLVR 5CC 16FR (CATHETERS) ×1 IMPLANT
CATH URTH STD 16FR FL 2W DRN (CATHETERS) ×1 IMPLANT
CHLORAPREP W/TINT 26 (MISCELLANEOUS) ×3 IMPLANT
CLIP VESOLOCK LG 6/CT PURPLE (CLIP) ×3 IMPLANT
CLIP VESOLOCK MED LG 6/CT (CLIP) IMPLANT
COVER MAYO STAND STRL (DRAPES) IMPLANT
COVER SURGICAL LIGHT HANDLE (MISCELLANEOUS) ×3 IMPLANT
COVER TIP SHEARS 8 DVNC (MISCELLANEOUS) ×1 IMPLANT
COVER TIP SHEARS 8MM DA VINCI (MISCELLANEOUS) ×3
COVER WAND RF STERILE (DRAPES) IMPLANT
DECANTER SPIKE VIAL GLASS SM (MISCELLANEOUS) ×7 IMPLANT
DERMABOND ADVANCED (GAUZE/BANDAGES/DRESSINGS) ×2
DERMABOND ADVANCED .7 DNX12 (GAUZE/BANDAGES/DRESSINGS) ×1 IMPLANT
DRAIN CHANNEL RND F F (WOUND CARE) IMPLANT
DRAPE ARM DVNC X/XI (DISPOSABLE) ×4 IMPLANT
DRAPE COLUMN DVNC XI (DISPOSABLE) ×1 IMPLANT
DRAPE DA VINCI XI ARM (DISPOSABLE) ×12
DRAPE DA VINCI XI COLUMN (DISPOSABLE) ×3
DRAPE INCISE IOBAN 66X45 STRL (DRAPES) ×3 IMPLANT
DRAPE SHEET LG 3/4 BI-LAMINATE (DRAPES) ×6 IMPLANT
DRAPE SURG IRRIG POUCH 19X23 (DRAPES) ×3 IMPLANT
ELECT PENCIL ROCKER SW 15FT (MISCELLANEOUS) ×2 IMPLANT
ELECT REM PT RETURN 15FT ADLT (MISCELLANEOUS) ×3 IMPLANT
GAUZE 4X4 16PLY RFD (DISPOSABLE) ×6 IMPLANT
GAUZE PACKING 2X5 YD STRL (GAUZE/BANDAGES/DRESSINGS) ×3 IMPLANT
GAUZE PACKING IODOFORM 2 (PACKING) ×2 IMPLANT
GLOVE BIO SURGEON STRL SZ 6.5 (GLOVE) ×2 IMPLANT
GLOVE BIO SURGEONS STRL SZ 6.5 (GLOVE) ×1
GLOVE BIOGEL M STRL SZ7.5 (GLOVE) ×9 IMPLANT
GLOVE BIOGEL PI IND STRL 8 (GLOVE) ×1 IMPLANT
GLOVE BIOGEL PI INDICATOR 8 (GLOVE) ×2
GOWN STRL REUS W/ TWL XL LVL3 (GOWN DISPOSABLE) ×2 IMPLANT
GOWN STRL REUS W/TWL LRG LVL3 (GOWN DISPOSABLE) ×3 IMPLANT
GOWN STRL REUS W/TWL XL LVL3 (GOWN DISPOSABLE) ×9 IMPLANT
HOLDER FOLEY CATH W/STRAP (MISCELLANEOUS) ×3 IMPLANT
IRRIG SUCT STRYKERFLOW 2 WTIP (MISCELLANEOUS) ×3
IRRIGATION SUCT STRKRFLW 2 WTP (MISCELLANEOUS) ×1 IMPLANT
KIT BASIN OR (CUSTOM PROCEDURE TRAY) ×3 IMPLANT
KIT TURNOVER KIT A (KITS) IMPLANT
MANIPULATOR UTERINE 4.5 ZUMI (MISCELLANEOUS) ×2 IMPLANT
MARKER SKIN DUAL TIP RULER LAB (MISCELLANEOUS) ×3 IMPLANT
MESH Y UPSYLON VAGINAL (Mesh General) ×2 IMPLANT
NEEDLE HYPO 22GX1.5 SAFETY (NEEDLE) IMPLANT
NS IRRIG 1000ML POUR BTL (IV SOLUTION) ×3 IMPLANT
OCCLUDER COLPOPNEUMO (BALLOONS) IMPLANT
PACK CYSTO (CUSTOM PROCEDURE TRAY) ×3 IMPLANT
PACKING VAGINAL (PACKING) IMPLANT
PAD POSITIONING PINK XL (MISCELLANEOUS) ×3 IMPLANT
PENCIL SMOKE EVACUATOR (MISCELLANEOUS) IMPLANT
PLUG CATH AND CAP STER (CATHETERS) ×3 IMPLANT
POUCH SPECIMEN RETRIEVAL 10MM (ENDOMECHANICALS) ×2 IMPLANT
RETRACTOR LONRSTAR 16.6X16.6CM (MISCELLANEOUS) ×1 IMPLANT
RETRACTOR STAY HOOK 5MM (MISCELLANEOUS) ×3 IMPLANT
RETRACTOR STER APS 16.6X16.6CM (MISCELLANEOUS) ×3
SEAL CANN UNIV 5-8 DVNC XI (MISCELLANEOUS) ×4 IMPLANT
SEAL XI 5MM-8MM UNIVERSAL (MISCELLANEOUS) ×12
SET IRRIG Y TYPE TUR BLADDER L (SET/KITS/TRAYS/PACK) ×3 IMPLANT
SET TUBE SMOKE EVAC HIGH FLOW (TUBING) ×3 IMPLANT
SHEET LAVH (DRAPES) ×3 IMPLANT
SLING LYNX SUPRAPUBIC (Sling) ×3 IMPLANT
SOLUTION ELECTROLUBE (MISCELLANEOUS) ×3 IMPLANT
SUT MNCRL AB 4-0 PS2 18 (SUTURE) ×6 IMPLANT
SUT PROLENE 2 0 CT 1 (SUTURE) ×3 IMPLANT
SUT VIC AB 0 CT1 27 (SUTURE) ×3
SUT VIC AB 0 CT1 27XBRD ANTBC (SUTURE) ×1 IMPLANT
SUT VIC AB 1 CT1 27 (SUTURE) ×3
SUT VIC AB 1 CT1 27XBRD ANTBC (SUTURE) IMPLANT
SUT VIC AB 2-0 SH 27 (SUTURE) ×15
SUT VIC AB 2-0 SH 27XBRD (SUTURE) ×5 IMPLANT
SUT VIC AB 2-0 UR6 27 (SUTURE) ×3 IMPLANT
SUT VIC AB 3-0 SH 27 (SUTURE)
SUT VIC AB 3-0 SH 27X BRD (SUTURE) IMPLANT
SUT VICRYL 0 UR6 27IN ABS (SUTURE) ×5 IMPLANT
SYR 10ML LL (SYRINGE) ×3 IMPLANT
SYR 50ML LL SCALE MARK (SYRINGE) IMPLANT
SYR BULB IRRIGATION 50ML (SYRINGE) IMPLANT
TOWEL OR 17X26 10 PK STRL BLUE (TOWEL DISPOSABLE) ×6 IMPLANT
TRAY LAPAROSCOPIC (CUSTOM PROCEDURE TRAY) ×3 IMPLANT
TROCAR ENDOPATH XCEL 12X100 BL (ENDOMECHANICALS) IMPLANT
TROCAR XCEL 12X100 BLDLESS (ENDOMECHANICALS) ×2 IMPLANT
TROCAR XCEL NON-BLD 5MMX100MML (ENDOMECHANICALS) IMPLANT
TUBING CONNECTING 10 (TUBING) ×2 IMPLANT
TUBING CONNECTING 10' (TUBING) ×1
WATER STERILE IRR 1000ML POUR (IV SOLUTION) ×6 IMPLANT
WATER STERILE IRR 500ML POUR (IV SOLUTION) ×3 IMPLANT
YANKAUER SUCT BULB TIP 10FT TU (MISCELLANEOUS) ×3 IMPLANT

## 2019-07-15 NOTE — Interval H&P Note (Signed)
History and Physical Interval Note:  07/15/2019 7:06 AM  Lindsay Peck  has presented today for surgery, with the diagnosis of PROLAPSE, STRESS INCONTINENCE.  The various methods of treatment have been discussed with the patient and family. After consideration of risks, benefits and other options for treatment, the patient has consented to  Procedure(s): XI ROBOTIC ASSISTED LAPAROSCOPIC [place] (N/A) PUBO-VAGINAL SLING (N/A) as a surgical intervention.  The patient's history has been reviewed, patient examined, no change in status, stable for surgery.  I have reviewed the patient's chart and labs.  Questions were answered to the patient's satisfaction.     Ardis Hughs

## 2019-07-15 NOTE — Transfer of Care (Signed)
Immediate Anesthesia Transfer of Care Note  Patient: Lindsay Peck  Procedure(s) Performed: XI ROBOTIC ASSISTED LAPAROSCOPIC SACROCOLPOPEXY,SUPRACERVICAL HYSTERECTOMY (N/A ) PUBO-VAGINAL SLING (N/A )  Patient Location: PACU  Anesthesia Type:General  Level of Consciousness: drowsy, patient cooperative and responds to stimulation  Airway & Oxygen Therapy: Patient Spontanous Breathing and Patient connected to face mask oxygen  Post-op Assessment: Report given to RN and Post -op Vital signs reviewed and stable  Post vital signs: Reviewed and stable  Last Vitals:  Vitals Value Taken Time  BP 122/68 07/15/19 1145  Temp    Pulse 57 07/15/19 1147  Resp 15 07/15/19 1147  SpO2 100 % 07/15/19 1147  Vitals shown include unvalidated device data.  Last Pain:  Vitals:   07/15/19 0550  TempSrc: Oral  PainSc: 0-No pain         Complications: No apparent anesthesia complications

## 2019-07-15 NOTE — H&P (Signed)
Pt presents today for pre-operative history and physical exam in anticipation of robotic assisted lap supracervical hysterectomy, sacrocolpopexy, and mid urethral sling by Dr. Louis Meckel on 07/15/19. She is doing well but is nervous about having to stop her supplements prior to surgery. Pt denies F/C, HA, CP, SOB, N/V, diarrhea/constipation, back pain, flank pain, hematuria, and dysuria.    HX:   The patient returns today for follow-up. She was seen earlier this month for pelvic organ prolapse. She was noted to have prolapse of the anterior vaginal wall to the level of the introitus. The patient noted extreme discomfort associated with her prolapse. We discussed the treatment options for her which would include robotic assisted sacral colpopexy and supracervical hysterectomy. She subsequently has undergone urodynamics.   UDS impression: The patient had slightly elevated postvoid residual around 100 mL. She had a normal bladder capacity. She had no significant overactive bladder component. She had evidence of stress incontinence with the prolapse reduced with leak point pressures between 48 in 58. As the volume of her bladder increased her leak point pressure decreased. She had normal detrusor pressure and good flow     ALLERGIES: Codeine - Nausea, Vomiting Mycins - unknown Penicillin - unknown Sulfa - Skin Rash    MEDICATIONS: Hydrochlorothiazide 25 mg tablet  Aspirin Ec 81 mg tablet, delayed release  Atorvastatin Calcium 10 mg tablet  Calcium  Cod Liver Oil  Docusate Calcium  Fish Oil  Ginger Root  Glucosamine  Miralax  Probiotic  Stool Softener  Turmeric  Vitamin C  Vitamin D3     GU PSH: Complex cystometrogram, w/ void pressure and urethral pressure profile studies, any technique - 05/16/2019 Complex Uroflow - 05/16/2019 Emg surf Electrd - 05/16/2019 Inject For cystogram - 05/16/2019 Intrabd voidng Press - 05/16/2019       PSH Notes: meniscus knee surgery (R)   NON-GU PSH: Remove  Ovarian Cyst(s) - 1959     GU PMH: Mixed incontinence - 05/16/2019 Female genital prolapse, unspecified - 04/14/2019    NON-GU PMH: Arthritis Hypercholesterolemia Hypertension    FAMILY HISTORY: 1 Daughter - Daughter 2 sons - Son   SOCIAL HISTORY: Marital Status: Married Current Smoking Status: Patient has never smoked.   Tobacco Use Assessment Completed: Used Tobacco in last 30 days? Has never drank.  Drinks 2 caffeinated drinks per day. Patient's occupation is/was Retired.    REVIEW OF SYSTEMS:    GU Review Female:   Patient denies being pregnant, trouble starting your stream, get up at night to urinate, burning /pain with urination, have to strain to urinate, frequent urination, hard to postpone urination, leakage of urine, and stream starts and stops.  Gastrointestinal (Upper):   Patient denies nausea, vomiting, and indigestion/ heartburn.  Gastrointestinal (Lower):   Patient denies diarrhea and constipation.  Constitutional:   Patient denies fever, night sweats, weight loss, and fatigue.  Skin:   Patient denies skin rash/ lesion and itching.  Eyes:   Patient denies blurred vision and double vision.  Ears/ Nose/ Throat:   Patient denies sore throat and sinus problems.  Hematologic/Lymphatic:   Patient denies swollen glands and easy bruising.  Cardiovascular:   Patient denies leg swelling and chest pains.  Respiratory:   Patient denies cough and shortness of breath.  Endocrine:   Patient denies excessive thirst.  Musculoskeletal:   Patient denies back pain and joint pain.  Neurological:   Patient denies headaches and dizziness.  Psychologic:   Patient denies depression and anxiety.   VITAL SIGNS:  07/05/2019 01:36 PM  Weight 160 lb / 72.57 kg  Height 61 in / 154.94 cm  BP 118/76 mmHg  Pulse 69 /min  Temperature 97.3 F / 36.2 C  BMI 30.2 kg/m   MULTI-SYSTEM PHYSICAL EXAMINATION:    Constitutional: Well-nourished. No physical deformities. Normally developed.  Good grooming.  Neck: Neck symmetrical, not swollen. Normal tracheal position.  Respiratory: Normal breath sounds. No labored breathing, no use of accessory muscles.   Cardiovascular: Regular rate and rhythm. No murmur, no gallop.   Lymphatic: No enlargement of neck, axillae, groin.  Skin: No paleness, no jaundice, no cyanosis. No lesion, no ulcer, no rash.  Neurologic / Psychiatric: Oriented to time, oriented to place, oriented to person. No depression, no anxiety, no agitation.  Gastrointestinal: No mass, no tenderness, no rigidity, non obese abdomen.  Eyes: Normal conjunctivae. Normal eyelids.  Ears, Nose, Mouth, and Throat: Left ear no scars, no lesions, no masses. Right ear no scars, no lesions, no masses. Nose no scars, no lesions, no masses. Normal hearing. Normal lips.  Musculoskeletal: Normal gait and station of head and neck.     PAST DATA REVIEWED:  Source Of History:  Patient  Records Review:   Previous Patient Records  Urine Test Review:   Urinalysis   07/05/19  Urinalysis  Urine Appearance Clear   Urine Color Yellow   Urine Glucose Neg mg/dL  Urine Bilirubin Neg mg/dL  Urine Ketones Neg mg/dL  Urine Specific Gravity 1.025   Urine Blood Neg ery/uL  Urine pH 5.5   Urine Protein Neg mg/dL  Urine Urobilinogen 0.2 mg/dL  Urine Nitrites Neg   Urine Leukocyte Esterase 1+ leu/uL  Urine WBC/hpf 0 - 5/hpf   Urine RBC/hpf 0 - 2/hpf   Urine Epithelial Cells 0 - 5/hpf   Urine Bacteria Rare (0-9/hpf)   Urine Mucous Present   Urine Yeast NS (Not Seen)   Urine Trichomonas Not Present   Urine Cystals NS (Not Seen)   Urine Casts NS (Not Seen)   Urine Sperm Not Present    PROCEDURES:          Urinalysis w/Scope - 81001 Dipstick Dipstick Cont'd Micro  Color: Yellow Bilirubin: Neg mg/dL WBC/hpf: 0 - 5/hpf  Appearance: Clear Ketones: Neg mg/dL RBC/hpf: 0 - 2/hpf  Specific Gravity: 1.025 Blood: Neg ery/uL Bacteria: Rare (0-9/hpf)  pH: 5.5 Protein: Neg mg/dL Cystals: NS (Not  Seen)  Glucose: Neg mg/dL Urobilinogen: 0.2 mg/dL Casts: NS (Not Seen)    Nitrites: Neg Trichomonas: Not Present    Leukocyte Esterase: 1+ leu/uL Mucous: Present      Epithelial Cells: 0 - 5/hpf      Yeast: NS (Not Seen)      Sperm: Not Present    ASSESSMENT:      ICD-10 Details  1 GU:   Female genital prolapse, unspecified - N81.9   2   Mixed incontinence - N39.46    PLAN:            Medications Stop Meds: Calcium  Discontinue: 07/05/2019  - Reason: The medication cycle was completed.            Schedule Return Visit/Planned Activity: Keep Scheduled Appointment - Schedule Surgery          Document Letter(s):  Created for Patient: Clinical Summary         Notes:   There are no changes in the patients history or physical exam since last evaluation by Dr. Louis Meckel. Pt is scheduled to undergo robotic hysterectomy,  sacrocolpopexy, and mid urethral sling on 07/15/19.   All pt's questions were answered to the best of my ability.

## 2019-07-15 NOTE — Progress Notes (Signed)
Patient is status post robotic assisted laparoscopic supracervical hysterectomy, sacrocolpopexy and retropubic mid urethral sling.  She is doing quite well after surgery.  She has no complaints.  Vitals:   07/15/19 1230 07/15/19 1245 07/15/19 1300 07/15/19 1423  BP:    122/70  Pulse:    78  Resp:   10 16  Temp: (!) 97.3 F (36.3 C) (!) 97.3 F (36.3 C) (!) 97.3 F (36.3 C) (!) 97.4 F (36.3 C)  TempSrc:    Oral  SpO2:    100%  Weight:      Height:        Intake/Output Summary (Last 24 hours) at 07/15/2019 1721 Last data filed at 07/15/2019 1621 Gross per 24 hour  Intake 2491.75 ml  Output 300 ml  Net 2191.75 ml   NAD Nonlabored breathing Abdomen is soft.  Appropriately tender.  Incisions are clean/dry and intact Vaginal packing is in place Foley catheter draining clear yellow urine Extremities are symmetric  Recent Labs    07/15/19 1242  HGB 13.4  HCT 42.0   No results for input(s): NA, K, CL, CO2, GLUCOSE, BUN, CREATININE, CALCIUM in the last 72 hours. No results for input(s): LABPT, INR in the last 72 hours. No results for input(s): PSA in the last 72 hours. No results for input(s): LABURIN in the last 72 hours.    Imp: Doing well postoperatively. Plan: DC vaginal packing in the morning DC Foley catheter in AM Advance diet as tolerated Plan for discharge tomorrow around lunch.

## 2019-07-15 NOTE — Anesthesia Preprocedure Evaluation (Addendum)
Anesthesia Evaluation  Patient identified by MRN, date of birth, ID band Patient awake    Reviewed: Allergy & Precautions, NPO status , Patient's Chart, lab work & pertinent test results  Airway Mallampati: II  TM Distance: >3 FB Neck ROM: Full    Dental no notable dental hx.    Pulmonary neg pulmonary ROS,    Pulmonary exam normal breath sounds clear to auscultation       Cardiovascular hypertension, negative cardio ROS Normal cardiovascular exam Rhythm:Regular Rate:Normal  HLD   Neuro/Psych negative neurological ROS  negative psych ROS   GI/Hepatic negative GI ROS, Neg liver ROS,   Endo/Other  negative endocrine ROS  Renal/GU negative Renal ROS  negative genitourinary   Musculoskeletal  (+) Arthritis ,   Abdominal   Peds  Hematology negative hematology ROS (+)   Anesthesia Other Findings   Reproductive/Obstetrics                             Anesthesia Physical Anesthesia Plan  ASA: II  Anesthesia Plan: General   Post-op Pain Management:    Induction: Intravenous  PONV Risk Score and Plan: 3 and Midazolam, Dexamethasone and Ondansetron  Airway Management Planned: Oral ETT  Additional Equipment:   Intra-op Plan:   Post-operative Plan: Extubation in OR  Informed Consent: I have reviewed the patients History and Physical, chart, labs and discussed the procedure including the risks, benefits and alternatives for the proposed anesthesia with the patient or authorized representative who has indicated his/her understanding and acceptance.     Dental advisory given  Plan Discussed with: CRNA  Anesthesia Plan Comments:         Anesthesia Quick Evaluation

## 2019-07-15 NOTE — Discharge Instructions (Signed)

## 2019-07-15 NOTE — Op Note (Signed)
Preoperative diagnosis:  1. Pelvic organ prolapse 2. Stress urinary incontinence  Postoperative diagnosis:  1. Same  Procedure: 1. Robotic-assisted laparoscopic supracervical hysterectomy and salpingo-ectomy 2. Robotic-assisted laparoscopic sacrocolpopexy 3. Retropubic mid-ureteral sling 4. Cystoscopy   Surgeon: Ardis Hughs, MD First assistant: Debbrah Alar, PA-C Resident Surgeon: Kerrie Pleasure, MD   Anesthesia: General  Complications: None  Intraoperative findings:  [place] Y mesh used for the sacrocolpopexy.  EBL: 50 mL  Specimens: Supracervical hysterectomy and bilateral salpingo-oopherectomy  Indication: Lindsay Peck is a 74 y.o. female patient with symptomatic pelvic organ prolapse.   After reviewing the management options for treatment, she elected to proceed with the above surgical procedure(s). We have discussed the potential benefits and risks of the procedure, side effects of the proposed treatment, the likelihood of the patient achieving the goals of the procedure, and any potential problems that might occur during the procedure or recuperation. Informed consent has been obtained.  Description of procedure:  An assistant was required for this surgical procedure.  The duties of the assistant included but were not limited to suctioning, passing suture, camera manipulation, retraction. This procedure would not be able to be performed without an Environmental consultant.  The patient was taken to the operating room and general anesthesia was induced. The patient was placed in the dorsal lithotomy position, prepped and draped in the usual sterile fashion, and preoperative antibiotics were administered. A preoperative time-out was performed.   A Foley catheter was then placed and placed to gravity drainage. I then made a periumbilical incision carrying the dissection down to the patient's fascia with electrocautery. Once to the fascia, the fascia was  incised and a small puncture hole made in the peritoneum to allow passage of a 77mm port.  The abdomen was insufflated and the remaining ports placed under digital guidance. 2 ports were placed lateral to the umbilicus on the right proximally 10 cm apart. The most lateral port was approximately 3 cm above the anterior iliac spine. 2 additional ports were placed in the patient's right side in comparable positions to the most lateral port on the right was a 12 mm port.the robot was then docked at an angle from the leg obliquely along the side of the left leg.  We then began our surgery by cleaning up some of the pelvic adhesions to the small bowel and colon. Once this was completed I started dissecting at the sacral promontory located 3 cm medial to the location where the ureter crosses over the iliac vessels at the pelvic brim. The posterior peritoneum was incised and the sacral prominence cleared off an area taking care to avoid the middle sacral vessels and the iliac branches. I then created a posterior peritoneal tunnel starting at the sacral promontory and tunneling down the right pelvic sidewall down into the pelvis breaking back through the posterior peritoneum around the vesico-vaginal junction posteriorly. I then continued the posterior dissection retracting down on the rectum and finding the avascular plane between the posterior vaginal wall and the rectum. I carried this dissection down as far as I could to along the area of the perineal body.  Then focused my attention to the uterus and hysterectomy. I first started by taking the right round ligament with a series of by polar cautery. I then dissected the anterior leaf of the broad ligament slightly more proximal and then distally down across the anterior mucosa salpinx and the internal cervical os. I then took of the uterine ovarian ligament on the  right and dissected free the right salpinx. Once the anterior leaf of the broad ligament had  been completely dissected on the patient's right and a small bladder flap had been created anteriorly attention was turned to the left side where a similar dissection was carried out. I then turned my attention to the anterior plane between the anterior vaginal wall and the bladder. I was able to obtain access to the avascular plane and with a combination of both monopolar cautery and blunt dissection was able to clean and nice down to the bladder neck. I then turned my attention back to the patient's uterus and skeletonized the right uterine artery and vein and then took this with a series of bipolar moves. I then performed a similar uterus pedicle ligation on the left. This point I was able to identify the patient's cervix and came through supracervical with monopolar cautery once the uterus was freed from all its attachments it was pushed into the left paracolic gutter and our attention was turned to placing the wire mesh.  Mesh was measured at approximately 7 cm anteriorly and 7 cm posteriorly and I cut this on the back table. The mesh was then placed into the patient's abdomen through the assistant port and the anterior leaf was secured down onto the anterior vaginal wall with the apex at the bladder neck. The posterior leaf was then secured down on the posterior vaginal wall. These were sewn down with 2-0 Vicryl. Between 6 and 8 were done on each side. At this point I then went back to the previously dissected sacral promontory and posterior peritoneal tunnel and inserted a instrument through the tunnel and grasped the end of the mesh at the vaginal cuff and pull it up to the sacrum. I then checked to ensure that the sacral mesh was not too tight by performing a vaginal exam. I then secured the sacral leg of the mesh using a 0 Prolene. I then reapproximated the posterior peritoneum with a 2-0 Vicryl in a running fashion around the sacral promontory. The pelvic peritoneum was closed using a  pursestring. A small Endo Catch bag was then gently passed through the assistant port and the uterus was placed in the bag. The bag was then brought out through the camera port once the trochars were removed. We then made a slightly larger extraction incision to remove the uterus. The fascia was then closed with 0 Vicryl in a figure-of-eight fashion. The skin was closed with 4-0 Monocryl's. Dermabond was applied to the incision and exparel injected into the incisions.  The Foley catheter was then capped. The horseshoe [place] retractor was then applied to the drape and the Foley catheter was attached to it. Using the skin hooks for skin nodes were placed in the corners of the labia minora to open up the vaginal vestibule. 5 cc of quarter percent Marcaine with 1% epinephrine was then injected into the periurethral tissue. The suprapubic incisions were then marked out 1 cm lateral to midline and 1 cm above the pubic bone. Using a 15 blade a stab incision was made in both sides of midline. Gauze is placed over these areas to control the skin bleeding. A 1.5 cm incision was then made in the mid urethra through the vaginal mucosa. Using this Lee scissors dissection was then carried out. Her urethra we laterally on both sides to the endopelvic fascia. The dissection was completed once there was enough space to place a finger through the incision on both  sides of the urethra. Using the [facility] needle set both needles were passed through the stab incisions suprapubically down posterior to the pubic bone and then through the periurethral incisions using the index finger to guide the needle out of the incision. Once both needles had been placed the Foley catheter was removed and cystoscopy was performed. A 70 lens was passed gently through the patient's urethra and into the bladder under visual guidance. A 360 cystoscopic evaluation was performed. There was no mucosal abnormalities or evidence of  perforation from the needles. The cystoscope was then removed and the Foley catheter replaced. The bladder was then drained again and the Foley catheter. The ends of the sling were then attached to the needles and the needles pulled up through the retropubic space and out the suprapubic incision. Once the sling was noted to be centered the blue The apex of the sling was cut and the plastic sheath was then pulled out. The mesh was noted to be well seated around the urethra. A right angle was used to ensure that the proper amount of tension for the sling around the urethra applied. The sling was noted to be tension free and the periurethral area and well positioned. At this point copious amounts of double antibiotic irrigation was then used to irrigate the incision the periurethral space and the vagina. The incision was then closed with 2-0 Vicryl in a running fashion. The stab incisions in the suprapubic area were closed with Dermabond. The vagina was then packed with clindamycin impregnated vaginal packing. The patient was subsequently extubated and returned to the PACU in stable condition.  Disposition: Vaginal packing will be removed in the PACU. The patient will be sent home with instructions to remove the Foley catheter in 24 hours. She will be given 3 days of antibiotics as well as pain medications. Followup has been scheduled for 2 weeks.

## 2019-07-15 NOTE — Anesthesia Procedure Notes (Signed)
Procedure Name: Intubation Date/Time: 07/15/2019 7:38 AM Performed by: Glory Buff, CRNA Pre-anesthesia Checklist: Patient identified, Emergency Drugs available, Suction available and Patient being monitored Patient Re-evaluated:Patient Re-evaluated prior to induction Oxygen Delivery Method: Circle system utilized Preoxygenation: Pre-oxygenation with 100% oxygen Induction Type: IV induction Ventilation: Mask ventilation without difficulty Laryngoscope Size: Miller and 3 Grade View: Grade I Tube type: Oral Tube size: 7.0 mm Number of attempts: 1 Airway Equipment and Method: Stylet and Oral airway Placement Confirmation: ETT inserted through vocal cords under direct vision,  positive ETCO2 and breath sounds checked- equal and bilateral Secured at: 21 cm Tube secured with: Tape Dental Injury: Teeth and Oropharynx as per pre-operative assessment

## 2019-07-16 DIAGNOSIS — N3946 Mixed incontinence: Secondary | ICD-10-CM | POA: Diagnosis not present

## 2019-07-16 LAB — BASIC METABOLIC PANEL
Anion gap: 9 (ref 5–15)
BUN: 9 mg/dL (ref 8–23)
CO2: 26 mmol/L (ref 22–32)
Calcium: 8.4 mg/dL — ABNORMAL LOW (ref 8.9–10.3)
Chloride: 103 mmol/L (ref 98–111)
Creatinine, Ser: 0.56 mg/dL (ref 0.44–1.00)
GFR calc Af Amer: >60 mL/min (ref >60)
GFR calc non Af Amer: >60 mL/min (ref >60)
Glucose, Bld: 116 mg/dL — ABNORMAL HIGH (ref 70–99)
Potassium: 3.3 mmol/L — ABNORMAL LOW (ref 3.5–5.1)
Sodium: 138 mmol/L (ref 135–145)

## 2019-07-16 LAB — HEMOGLOBIN AND HEMATOCRIT, BLOOD
HCT: 36.9 % (ref 36.0–46.0)
Hemoglobin: 11.8 g/dL — ABNORMAL LOW (ref 12.0–15.0)

## 2019-07-16 NOTE — Progress Notes (Signed)
Report received from Ria Bush, RN. No change since initial pm assessment. Will continue to monitor and follow the POC.

## 2019-07-16 NOTE — Discharge Planning (Signed)
IV removed. RN assessment and VS revealed stability for DC to home with daughter.  DC papers printed, given, educated and explained.  Talked about cleaning surgery sites but not in bath.  Avoid lifting and bending but continue to be active.  Discussed when to contact Dr. and to maintain FU appt.  Send scripts to pharm.  Urinated 150cc prior to DC.  Once ready, was wheeled to front and daughter transported home via car.

## 2019-07-16 NOTE — Discharge Summary (Signed)
Patient ID: Lindsay Peck MRN: [id number] DOB/AGE: February 19, 1946 74 y.o.  Admit date: 07/15/2019 Discharge date: 07/16/2019  Primary Care Physician:  Kathyrn Lass, MD  Discharge Diagnoses: Cystocele with prolapse    Discharge Medications: Allergies as of 07/16/2019      Reactions   Codeine Nausea Only   Penicillins Other (See Comments)   Did it involve swelling of the face/tongue/throat, SOB, or low BP? Unknown Did it involve sudden or severe rash/hives, skin peeling, or any reaction on the inside of your mouth or nose? Unknown Did you need to seek medical attention at a hospital or doctor's office? Unknown When did it last happen?Childhood If all above answers are "NO", may proceed with cephalosporin use.   Other Rash   Mycin Drugs/ unkown   Sulfur Rash      Medication List    STOP taking these medications   aspirin EC 81 MG tablet   CALCIUM 1200 PO   COD LIVER OIL PO   Fish Oil 1200 MG Cpdr   Ginger Root 550 MG Caps   GLUCOSAMINE HCL PO   meloxicam 15 MG tablet Commonly known as: MOBIC   Turmeric 500 MG Tabs   vitamin C 1000 MG tablet   Vitamin D-3 125 MCG (5000 UT) Tabs     TAKE these medications   atorvastatin 10 MG tablet Commonly known as: LIPITOR Take 10 mg by mouth daily.   ciprofloxacin 500 MG tablet Commonly known as: Cipro Take 1 tablet (500 mg total) by mouth 2 (two) times daily.   docusate sodium 100 MG capsule Commonly known as: COLACE Take 100 mg by mouth daily as needed for mild constipation.   estradiol 0.1 MG/GM vaginal cream Commonly known as: ESTRACE VAGINAL Place 1 Applicatorful vaginally 3 (three) times a week. Use one small pea sized dolyp on tip of finger and swap just inside the vagina every other day.   hydrochlorothiazide 25 MG tablet Commonly known as: HYDRODIURIL Take 25 mg by mouth daily.   polyethylene glycol 17 g packet Commonly known as: MIRALAX / GLYCOLAX Take 17 g by mouth daily.   PROBIOTIC DAILY  PO Take 1 tablet by mouth daily. 100 Million organism   traMADol 50 MG tablet Commonly known as: Ultram Take 1-2 tablets (50-100 mg total) by mouth every 6 (six) hours as needed for moderate pain or severe pain.        Significant Diagnostic Studies:  No results found.  Brief H and P: For complete details please refer to admission H and P, but in brief patient was admitted for robotic assisted hysterectomy, sacrocolpopexy as well as mid urethral sling  Hospital Course: The patient had no postoperative complications.  Packing removed on postoperative day #1, Foley removed as well.  She was discharged after she voided. Active Problems:   Cystocele with prolapse   Day of Discharge BP (!) 115/57   Pulse 70   Temp 98 F (36.7 C) (Oral)   Resp 17   Ht 5\' 1"  (1.549 m)   Wt 70.3 kg   SpO2 98%   BMI 29.29 kg/m   Results for orders placed or performed during the hospital encounter of 07/15/19 (from the past 24 hour(s))  Hemoglobin and hematocrit, blood     Status: None   Collection Time: 07/15/19 12:42 PM  Result Value Ref Range   Hemoglobin 13.4 12.0 - 15.0 g/dL   HCT 42.0 36.0 - [id number] %  Basic metabolic panel     Status: Abnormal  Collection Time: 07/16/19  4:25 AM  Result Value Ref Range   Sodium 138 135 - 145 mmol/L   Potassium 3.3 (L) 3.5 - 5.1 mmol/L   Chloride 103 98 - 111 mmol/L   CO2 26 22 - 32 mmol/L   Glucose, Bld 116 (H) 70 - 99 mg/dL   BUN 9 8 - 23 mg/dL   Creatinine, Ser 0.56 0.44 - 1.00 mg/dL   Calcium 8.4 (L) 8.9 - 10.3 mg/dL   GFR calc non Af Amer >60 >60 mL/min   GFR calc Af Amer >60 >60 mL/min   Anion gap 9 5 - 15  Hemoglobin and hematocrit, blood     Status: Abnormal   Collection Time: 07/16/19  4:25 AM  Result Value Ref Range   Hemoglobin 11.8 (L) 12.0 - 15.0 g/dL   HCT 36.9 36.0 - 46.0 %    Physical Exam: General: Alert and awake oriented x3 not in any acute distress. HEENT: anicteric sclera, pupils reactive to light and accommodation CVS:  S1-S2 clear no murmur rubs or gallops Chest: clear to auscultation bilaterally, no wheezing rales or rhonchi Abdomen: soft nontender, nondistended, normal bowel sounds, no organomegaly Extremities: no cyanosis, clubbing or edema noted bilaterally Neuro: Cranial nerves II-XII intact, no focal neurological deficits  Disposition: Home  Diet: Regular  Activity: Discussed with patient   Disposition and Follow-up:   Follow-up scheduled on April 1    [place], Bree Tharpe, NP On 07/28/2019.   Specialty: Urology Why: at 9:45 Contact information: [address] [place] [address] [phone]           Time spent on Discharge:  10 minutes  Signed: Lillette Boxer Dawit Tankard 07/16/2019, 10:39 AM

## 2019-07-18 LAB — SURGICAL PATHOLOGY

## 2019-07-19 ENCOUNTER — Encounter: Payer: Self-pay | Admitting: *Deleted

## 2019-07-19 NOTE — Anesthesia Postprocedure Evaluation (Signed)
Anesthesia Post Note  Patient: Lindsay Peck  Procedure(s) Performed: XI ROBOTIC ASSISTED LAPAROSCOPIC [place] (N/A ) PUBO-VAGINAL SLING (N/A )     Patient location during evaluation: PACU Anesthesia Type: General Level of consciousness: awake and alert Pain management: pain level controlled Vital Signs Assessment: post-procedure vital signs reviewed and stable Respiratory status: spontaneous breathing, nonlabored ventilation, respiratory function stable and patient connected to nasal cannula oxygen Cardiovascular status: blood pressure returned to baseline and stable Postop Assessment: no apparent nausea or vomiting Anesthetic complications: no    Last Vitals:  Vitals:   07/16/19 0418 07/16/19 1516  BP: (!) 115/57 137/63  Pulse: 70 (!) 57  Resp: 17 18  Temp: 36.7 C 36.4 C  SpO2: 98% 99%    Last Pain:  Vitals:   07/16/19 1516  TempSrc: Axillary  PainSc:                  [name] [name]

## 2019-07-27 ENCOUNTER — Other Ambulatory Visit: Payer: Self-pay | Admitting: Urology

## 2019-08-04 ENCOUNTER — Encounter (HOSPITAL_BASED_OUTPATIENT_CLINIC_OR_DEPARTMENT_OTHER): Payer: Self-pay | Admitting: Urology

## 2019-08-04 ENCOUNTER — Other Ambulatory Visit: Payer: Self-pay

## 2019-08-04 NOTE — Progress Notes (Signed)
Spoke w/ via phone for pre-op interview---PATIENT Lab needs dos---- I STAT 8             Lab results------EKG 07-11-2019 EPIC COVID test ------08-08-2019 252 PM Arrive at -------915 AM 08-11-2019 NPO after ------MIDNIGHT Medications to take morning of surgery ----- Diabetic medication -----ATORVASTATIN Patient Special Instructions -----NONE Pre-Op special Istructions -----NONE Patient verbalized understanding of instructions that were given at this phone interview. Patient denies shortness of breath, chest pain, fever, cough a this phone interview.

## 2019-08-08 ENCOUNTER — Other Ambulatory Visit (HOSPITAL_COMMUNITY): Payer: Medicare HMO

## 2019-08-11 ENCOUNTER — Ambulatory Visit (HOSPITAL_BASED_OUTPATIENT_CLINIC_OR_DEPARTMENT_OTHER): Admission: RE | Admit: 2019-08-11 | Payer: Medicare HMO | Source: Home / Self Care | Admitting: Urology

## 2019-08-11 HISTORY — DX: Retention of urine, unspecified: R33.9

## 2019-08-11 HISTORY — DX: Unspecified fracture of shaft of humerus, right arm, initial encounter for closed fracture: S42.301A

## 2019-08-11 SURGERY — CREATION, PUBOVAGINAL SLING
Anesthesia: General

## 2020-05-01 DIAGNOSIS — R3915 Urgency of urination: Secondary | ICD-10-CM | POA: Diagnosis not present

## 2020-05-08 DIAGNOSIS — R3915 Urgency of urination: Secondary | ICD-10-CM | POA: Diagnosis not present

## 2020-05-17 DIAGNOSIS — D0462 Carcinoma in situ of skin of left upper limb, including shoulder: Secondary | ICD-10-CM | POA: Diagnosis not present

## 2020-05-17 DIAGNOSIS — R3915 Urgency of urination: Secondary | ICD-10-CM | POA: Diagnosis not present

## 2020-05-22 DIAGNOSIS — N3946 Mixed incontinence: Secondary | ICD-10-CM | POA: Diagnosis not present

## 2020-05-22 DIAGNOSIS — R3915 Urgency of urination: Secondary | ICD-10-CM | POA: Diagnosis not present

## 2020-05-29 DIAGNOSIS — R3915 Urgency of urination: Secondary | ICD-10-CM | POA: Diagnosis not present

## 2020-05-29 DIAGNOSIS — N3946 Mixed incontinence: Secondary | ICD-10-CM | POA: Diagnosis not present

## 2020-06-07 DIAGNOSIS — C44311 Basal cell carcinoma of skin of nose: Secondary | ICD-10-CM | POA: Diagnosis not present

## 2020-06-19 DIAGNOSIS — R3915 Urgency of urination: Secondary | ICD-10-CM | POA: Diagnosis not present

## 2020-07-12 DIAGNOSIS — L821 Other seborrheic keratosis: Secondary | ICD-10-CM | POA: Diagnosis not present

## 2020-07-12 DIAGNOSIS — L570 Actinic keratosis: Secondary | ICD-10-CM | POA: Diagnosis not present

## 2020-07-17 DIAGNOSIS — N3946 Mixed incontinence: Secondary | ICD-10-CM | POA: Diagnosis not present

## 2020-07-17 DIAGNOSIS — R3915 Urgency of urination: Secondary | ICD-10-CM | POA: Diagnosis not present

## 2020-08-07 DIAGNOSIS — H5213 Myopia, bilateral: Secondary | ICD-10-CM | POA: Diagnosis not present

## 2020-08-07 DIAGNOSIS — Z0100 Encounter for examination of eyes and vision without abnormal findings: Secondary | ICD-10-CM | POA: Diagnosis not present

## 2020-08-14 DIAGNOSIS — R3915 Urgency of urination: Secondary | ICD-10-CM | POA: Diagnosis not present

## 2020-08-14 DIAGNOSIS — N3946 Mixed incontinence: Secondary | ICD-10-CM | POA: Diagnosis not present

## 2020-12-18 DIAGNOSIS — I781 Nevus, non-neoplastic: Secondary | ICD-10-CM | POA: Diagnosis not present

## 2020-12-18 DIAGNOSIS — L821 Other seborrheic keratosis: Secondary | ICD-10-CM | POA: Diagnosis not present

## 2020-12-18 DIAGNOSIS — Z85828 Personal history of other malignant neoplasm of skin: Secondary | ICD-10-CM | POA: Diagnosis not present

## 2020-12-18 DIAGNOSIS — L814 Other melanin hyperpigmentation: Secondary | ICD-10-CM | POA: Diagnosis not present

## 2020-12-18 DIAGNOSIS — L84 Corns and callosities: Secondary | ICD-10-CM | POA: Diagnosis not present

## 2020-12-18 DIAGNOSIS — B351 Tinea unguium: Secondary | ICD-10-CM | POA: Diagnosis not present

## 2020-12-18 DIAGNOSIS — L570 Actinic keratosis: Secondary | ICD-10-CM | POA: Diagnosis not present

## 2020-12-19 ENCOUNTER — Ambulatory Visit (INDEPENDENT_AMBULATORY_CARE_PROVIDER_SITE_OTHER): Payer: Medicare HMO

## 2020-12-19 ENCOUNTER — Ambulatory Visit: Payer: Medicare HMO | Admitting: Orthopaedic Surgery

## 2020-12-19 ENCOUNTER — Other Ambulatory Visit: Payer: Self-pay

## 2020-12-19 VITALS — Ht 60.25 in | Wt 171.0 lb

## 2020-12-19 DIAGNOSIS — M25561 Pain in right knee: Secondary | ICD-10-CM

## 2020-12-19 DIAGNOSIS — G8929 Other chronic pain: Secondary | ICD-10-CM

## 2020-12-19 NOTE — Progress Notes (Signed)
Office Visit Note   Patient: Lindsay Peck           Date of Birth: 1945-10-19           MRN: [id number] Visit Date: 12/19/2020              Requested by: Kathyrn Lass, [place],  [place] [id number] PCP: Kathyrn Lass, MD   Assessment & Plan: Visit Diagnoses:  1. Chronic pain of right knee     Plan: She was given reassurance that she does have a good baseline of what is going on with her right knee for now.  I would still have her avoid high impact aerobic activities and if he gets bad enough her pain standpoint to consider other conservative treatments such as quad training exercises through outpatient therapy and even steroid injections.  All questions and concerns were answered and addressed.  Follow-up can be as needed.  Follow-Up Instructions: Return if symptoms worsen or fail to improve.   Orders:  Orders Placed This Encounter  Procedures   XR Knee 1-2 Views Right   No orders of the defined types were placed in this encounter.     Procedures: No procedures performed   Clinical Data: No additional findings.   Subjective: Chief Complaint  Patient presents with   Right Knee - Pain  The patient is actually well-known to me.  We actually performed arthroscopic surgery on her right knee about 4 years ago for a meniscal tear.  She is 75 years old and tries to stay active.  She does have some knee discomfort from time to time and wanted her knee to be checked out so she has a good baseline was going on with her knee.  She says her husband started to have medical issues and she needs to be able to take care of him.  She does perform her exercises for her legs but does not perform any high impact aerobic activities.  She denies any locking and catching.  Her knees do not hurt on a daily basis.  HPI  Review of Systems There is currently listed no headache, chest pain, shortness of breath, fever, chills, nausea, vomiting  Objective: Vital Signs: Ht 5'  0.25" (1.53 m)   Wt 171 lb (77.6 kg)   BMI 33.12 kg/m   Physical Exam She is alert and oriented x3 and in no acute distress Ortho Exam Examination of her right knee shows just some mild medial joint line tenderness and some mild patellofemoral tenderness but otherwise excellent range of motion.  The knee is ligamentously stable with no effusion. Specialty Comments:  No specialty comments available.  Imaging: XR Knee 1-2 Views Right  Result Date: 12/19/2020 2 views of the right knee show no acute findings.  There is moderate arthritic changes with slight joint space narrowing medially and the patellofemoral joint small osteophytes.  There is no effusion.    PMFS History: Patient Active Problem List   Diagnosis Date Noted   Cystocele with prolapse 07/15/2019   Status post arthroscopy of right knee 10/22/2016   Acute pain of right knee 08/25/2016   Complex tear of medial meniscus of right knee as current injury 08/25/2016   Past Medical History:  Diagnosis Date   Arthritis    "In hand"   Cancer ([place])    Precancerous on hand (Bilaterally)   Hypertension    Right arm fracture    Urinary retention     No family history  on file.  Past Surgical History:  Procedure Laterality Date   EYE SURGERY Bilateral    Cataract   PUBOVAGINAL SLING N/A 07/15/2019   Procedure: Gaynelle Arabian;  Surgeon: Ardis Hughs, MD;  Location: [facility];  Service: Urology;  Laterality: N/A;   ROBOTIC ASSISTED LAPAROSCOPIC SACROCOLPOPEXY N/A 07/15/2019   Procedure: XI ROBOTIC ASSISTED LAPAROSCOPIC [place];  Surgeon: Ardis Hughs, MD;  Location: [facility];  Service: Urology;  Laterality: N/A;   TUBAL LIGATION     Social History   Occupational History   Not on file  Tobacco Use   Smoking status: Never   Smokeless tobacco: Never  Vaping Use   Vaping Use: Not on file  Substance and Sexual Activity   Alcohol use: Never   Drug use: Never   Sexual activity:  Not on file

## 2021-01-20 DIAGNOSIS — Z20822 Contact with and (suspected) exposure to covid-19: Secondary | ICD-10-CM | POA: Diagnosis not present

## 2021-02-01 DIAGNOSIS — Z1231 Encounter for screening mammogram for malignant neoplasm of breast: Secondary | ICD-10-CM | POA: Diagnosis not present

## 2021-02-04 DIAGNOSIS — R1011 Right upper quadrant pain: Secondary | ICD-10-CM | POA: Diagnosis not present

## 2021-02-04 DIAGNOSIS — R300 Dysuria: Secondary | ICD-10-CM | POA: Diagnosis not present

## 2021-02-08 ENCOUNTER — Other Ambulatory Visit: Payer: Self-pay | Admitting: Family Medicine

## 2021-02-08 DIAGNOSIS — R1011 Right upper quadrant pain: Secondary | ICD-10-CM

## 2021-02-26 ENCOUNTER — Other Ambulatory Visit: Payer: Self-pay

## 2021-02-26 ENCOUNTER — Other Ambulatory Visit: Payer: Medicare HMO

## 2021-02-26 ENCOUNTER — Ambulatory Visit
Admission: RE | Admit: 2021-02-26 | Discharge: 2021-02-26 | Disposition: A | Discharge status: Home / Self Care | Payer: Medicare HMO | Source: Ambulatory Visit | Attending: Family Medicine | Admitting: Family Medicine

## 2021-02-26 DIAGNOSIS — K8020 Calculus of gallbladder without cholecystitis without obstruction: Secondary | ICD-10-CM | POA: Diagnosis not present

## 2021-02-26 DIAGNOSIS — R1011 Right upper quadrant pain: Secondary | ICD-10-CM

## 2021-02-26 DIAGNOSIS — J984 Other disorders of lung: Secondary | ICD-10-CM | POA: Diagnosis not present

## 2021-02-26 MED ORDER — IOPAMIDOL (ISOVUE-300) INJECTION 61%
100.0000 mL | Freq: Once | INTRAVENOUS | Status: AC | PRN
  Administered 2021-02-26: 100 mL via INTRAVENOUS

## 2021-04-08 DIAGNOSIS — M25511 Pain in right shoulder: Secondary | ICD-10-CM | POA: Diagnosis not present

## 2021-04-08 DIAGNOSIS — R1084 Generalized abdominal pain: Secondary | ICD-10-CM | POA: Diagnosis not present

## 2021-05-02 DIAGNOSIS — Z85828 Personal history of other malignant neoplasm of skin: Secondary | ICD-10-CM | POA: Diagnosis not present

## 2021-05-02 DIAGNOSIS — L578 Other skin changes due to chronic exposure to nonionizing radiation: Secondary | ICD-10-CM | POA: Diagnosis not present

## 2021-05-02 DIAGNOSIS — D225 Melanocytic nevi of trunk: Secondary | ICD-10-CM | POA: Diagnosis not present

## 2021-05-02 DIAGNOSIS — L720 Epidermal cyst: Secondary | ICD-10-CM | POA: Diagnosis not present

## 2021-05-02 DIAGNOSIS — L821 Other seborrheic keratosis: Secondary | ICD-10-CM | POA: Diagnosis not present

## 2021-05-02 DIAGNOSIS — Z86018 Personal history of other benign neoplasm: Secondary | ICD-10-CM | POA: Diagnosis not present

## 2021-05-02 DIAGNOSIS — L570 Actinic keratosis: Secondary | ICD-10-CM | POA: Diagnosis not present

## 2021-05-02 DIAGNOSIS — L814 Other melanin hyperpigmentation: Secondary | ICD-10-CM | POA: Diagnosis not present

## 2021-05-02 DIAGNOSIS — D171 Benign lipomatous neoplasm of skin and subcutaneous tissue of trunk: Secondary | ICD-10-CM | POA: Diagnosis not present

## 2021-05-08 DIAGNOSIS — B351 Tinea unguium: Secondary | ICD-10-CM | POA: Diagnosis not present

## 2021-05-08 DIAGNOSIS — M792 Neuralgia and neuritis, unspecified: Secondary | ICD-10-CM | POA: Diagnosis not present

## 2021-05-14 DIAGNOSIS — M25511 Pain in right shoulder: Secondary | ICD-10-CM | POA: Diagnosis not present

## 2021-05-20 DIAGNOSIS — L570 Actinic keratosis: Secondary | ICD-10-CM | POA: Diagnosis not present

## 2021-05-21 DIAGNOSIS — M6281 Muscle weakness (generalized): Secondary | ICD-10-CM | POA: Diagnosis not present

## 2021-05-21 DIAGNOSIS — M25511 Pain in right shoulder: Secondary | ICD-10-CM | POA: Diagnosis not present

## 2021-05-28 DIAGNOSIS — M6281 Muscle weakness (generalized): Secondary | ICD-10-CM | POA: Diagnosis not present

## 2021-05-28 DIAGNOSIS — M25511 Pain in right shoulder: Secondary | ICD-10-CM | POA: Diagnosis not present

## 2021-06-06 DIAGNOSIS — M6281 Muscle weakness (generalized): Secondary | ICD-10-CM | POA: Diagnosis not present

## 2021-06-06 DIAGNOSIS — M25511 Pain in right shoulder: Secondary | ICD-10-CM | POA: Diagnosis not present

## 2021-06-19 DIAGNOSIS — M25511 Pain in right shoulder: Secondary | ICD-10-CM | POA: Diagnosis not present

## 2021-06-19 DIAGNOSIS — M6281 Muscle weakness (generalized): Secondary | ICD-10-CM | POA: Diagnosis not present

## 2021-06-25 DIAGNOSIS — Z23 Encounter for immunization: Secondary | ICD-10-CM | POA: Diagnosis not present

## 2021-06-25 DIAGNOSIS — R238 Other skin changes: Secondary | ICD-10-CM | POA: Diagnosis not present

## 2021-06-25 DIAGNOSIS — L814 Other melanin hyperpigmentation: Secondary | ICD-10-CM | POA: Diagnosis not present

## 2021-07-01 DIAGNOSIS — M6281 Muscle weakness (generalized): Secondary | ICD-10-CM | POA: Diagnosis not present

## 2021-07-01 DIAGNOSIS — M25511 Pain in right shoulder: Secondary | ICD-10-CM | POA: Diagnosis not present

## 2021-07-10 DIAGNOSIS — M25511 Pain in right shoulder: Secondary | ICD-10-CM | POA: Diagnosis not present

## 2021-07-10 DIAGNOSIS — M6281 Muscle weakness (generalized): Secondary | ICD-10-CM | POA: Diagnosis not present

## 2021-07-16 DIAGNOSIS — M6281 Muscle weakness (generalized): Secondary | ICD-10-CM | POA: Diagnosis not present

## 2021-07-16 DIAGNOSIS — M25511 Pain in right shoulder: Secondary | ICD-10-CM | POA: Diagnosis not present

## 2021-07-30 DIAGNOSIS — M25511 Pain in right shoulder: Secondary | ICD-10-CM | POA: Diagnosis not present

## 2021-07-30 DIAGNOSIS — M6281 Muscle weakness (generalized): Secondary | ICD-10-CM | POA: Diagnosis not present

## 2021-07-31 DIAGNOSIS — D171 Benign lipomatous neoplasm of skin and subcutaneous tissue of trunk: Secondary | ICD-10-CM | POA: Diagnosis not present

## 2021-07-31 DIAGNOSIS — L570 Actinic keratosis: Secondary | ICD-10-CM | POA: Diagnosis not present

## 2021-08-13 DIAGNOSIS — M25511 Pain in right shoulder: Secondary | ICD-10-CM | POA: Diagnosis not present

## 2021-08-13 DIAGNOSIS — M6281 Muscle weakness (generalized): Secondary | ICD-10-CM | POA: Diagnosis not present

## 2021-08-26 DIAGNOSIS — H6121 Impacted cerumen, right ear: Secondary | ICD-10-CM | POA: Diagnosis not present

## 2021-08-26 DIAGNOSIS — H6980 Other specified disorders of Eustachian tube, unspecified ear: Secondary | ICD-10-CM | POA: Diagnosis not present

## 2021-08-26 DIAGNOSIS — R42 Dizziness and giddiness: Secondary | ICD-10-CM | POA: Diagnosis not present

## 2021-09-04 DIAGNOSIS — M6281 Muscle weakness (generalized): Secondary | ICD-10-CM | POA: Diagnosis not present

## 2021-09-04 DIAGNOSIS — M25511 Pain in right shoulder: Secondary | ICD-10-CM | POA: Diagnosis not present

## 2021-09-13 DIAGNOSIS — M6281 Muscle weakness (generalized): Secondary | ICD-10-CM | POA: Diagnosis not present

## 2021-09-13 DIAGNOSIS — M25511 Pain in right shoulder: Secondary | ICD-10-CM | POA: Diagnosis not present

## 2021-09-17 ENCOUNTER — Ambulatory Visit (INDEPENDENT_AMBULATORY_CARE_PROVIDER_SITE_OTHER): Payer: Medicare HMO

## 2021-09-17 ENCOUNTER — Ambulatory Visit: Payer: Medicare HMO | Admitting: Podiatry

## 2021-09-17 DIAGNOSIS — M79671 Pain in right foot: Secondary | ICD-10-CM | POA: Diagnosis not present

## 2021-09-17 DIAGNOSIS — M7741 Metatarsalgia, right foot: Secondary | ICD-10-CM | POA: Diagnosis not present

## 2021-09-17 MED ORDER — MELOXICAM 7.5 MG PO TABS: 7.5000 mg | ORAL_TABLET | Freq: Every day | ORAL | 0 refills | Status: DC | PRN

## 2021-09-20 DIAGNOSIS — M6281 Muscle weakness (generalized): Secondary | ICD-10-CM | POA: Diagnosis not present

## 2021-09-20 DIAGNOSIS — M25511 Pain in right shoulder: Secondary | ICD-10-CM | POA: Diagnosis not present

## 2021-09-25 NOTE — Progress Notes (Signed)
Subjective:   Patient ID: Lindsay Peck, female   DOB: 76 y.o.   MRN: [id number]   HPI 76 year old female presents the office today for concerns of pain to her right foot on the ball of her foot which started out 3 weeks ago.  She describes the pain as sharp.  No injuries in this started.  She has been using a massage gun which is helped a lot but she is having some symptoms.  She states that the second and third toes are pointing down.  No swelling or injuries.  No other concerns.   Review of Systems  All other systems reviewed and are negative.  Past Medical History:  Diagnosis Date   Arthritis    "In hand"   Cancer ([place])    Precancerous on hand (Bilaterally)   Hypertension    Right arm fracture    Urinary retention     Past Surgical History:  Procedure Laterality Date   EYE SURGERY Bilateral    Cataract   PUBOVAGINAL SLING N/A 07/15/2019   Procedure: Gaynelle Arabian;  Surgeon: Ardis Hughs, MD;  Location: [facility];  Service: Urology;  Laterality: N/A;   ROBOTIC ASSISTED LAPAROSCOPIC SACROCOLPOPEXY N/A 07/15/2019   Procedure: XI ROBOTIC ASSISTED LAPAROSCOPIC [place];  Surgeon: Ardis Hughs, MD;  Location: [facility];  Service: Urology;  Laterality: N/A;   TUBAL LIGATION       Current Outpatient Medications:    meloxicam (MOBIC) 7.5 MG tablet, Take 1 tablet (7.5 mg total) by mouth daily as needed for pain., Disp: 14 tablet, Rfl: 0   Ascorbic Acid (VITAMIN C) 1000 MG tablet, Take 1,000 mg by mouth daily., Disp: , Rfl:    aspirin EC 81 MG tablet, Take 81 mg by mouth daily. Stopped since 07-15-2019 surgery, Disp: , Rfl:    atorvastatin (LIPITOR) 10 MG tablet, Take 10 mg by mouth daily., Disp: , Rfl:    Calcium Carbonate-Vit D-Min (CALCIUM 1200 PO), Take by mouth. 2 pills daily, Disp: , Rfl:    ciprofloxacin (CIPRO) 500 MG tablet, Take 1 tablet (500 mg total) by mouth 2 (two) times daily., Disp: 6 tablet, Rfl: 0   COD LIVER OIL PO, Take  by mouth. 415 mg 2 pills daily, Disp: , Rfl:    docusate sodium (COLACE) 100 MG capsule, Take 100 mg by mouth daily as needed for mild constipation., Disp: , Rfl:    estradiol (ESTRACE VAGINAL) 0.1 MG/GM vaginal cream, Place 1 Applicatorful vaginally 3 (three) times a week. Use one small pea sized dolyp on tip of finger and swap just inside the vagina every other day. (Patient not taking: Reported on 08/04/2019), Disp: 42.5 g, Rfl: 12   Ginger, Zingiber officinalis, (GINGER ROOT) 550 MG CAPS, Take by mouth daily., Disp: , Rfl:    hydrochlorothiazide (HYDRODIURIL) 25 MG tablet, Take 25 mg by mouth daily. , Disp: , Rfl: 1   metroNIDAZOLE (FLAGYL) 500 MG tablet, Take 500 mg by mouth 3 (three) times daily. Finishes Monday 08-08-2019, Disp: , Rfl:    OVER THE COUNTER MEDICATION, Glucosamine 2000 mg 2 pills daily, Disp: , Rfl:    polyethylene glycol (MIRALAX / GLYCOLAX) 17 g packet, Take 17 g by mouth daily., Disp: , Rfl:    Probiotic Product (PROBIOTIC DAILY PO), Take 1 tablet by mouth daily. 100 Million organism, Disp: , Rfl:    traMADol (ULTRAM) 50 MG tablet, Take 1-2 tablets (50-100 mg total) by mouth every 6 (six) hours as needed for moderate pain  or severe pain. (Patient not taking: Reported on 08/04/2019), Disp: 20 tablet, Rfl: 0   Turmeric (QC TUMERIC COMPLEX) 500 MG CAPS, Take by mouth daily., Disp: , Rfl:    UNABLE TO FIND, Med Name:vitamin d 3 5000 iu daily, Disp: , Rfl:   Allergies  Allergen Reactions   Codeine Nausea Only   Penicillins Other (See Comments)    Did it involve swelling of the face/tongue/throat, SOB, or low BP? Unknown Did it involve sudden or severe rash/hives, skin peeling, or any reaction on the inside of your mouth or nose? Unknown Did you need to seek medical attention at a hospital or doctor's office? Unknown When did it last happen?      Childhood If all above answers are "NO", may proceed with cephalosporin use.   Elemental Sulfur Rash   Other Rash    Mycin Drugs/  unkown         Objective:  Physical Exam  General: AAO x3, NAD  Dermatological: Skin is warm, dry and supple bilateral. There are no open sores, no preulcerative lesions, no rash or signs of infection present.  Vascular: Dorsalis Pedis artery and Posterior Tibial artery pedal pulses are 2/4 bilateral with immedate capillary fill time. There is no pain with calf compression, swelling, warmth, erythema.   Neruologic: Grossly intact via light touch bilateral.  Negative Tinel sign.  Musculoskeletal: Prominence of metatarsal heads.  Majority discomfort on the second and third MPJs along the interspace.  No significant palpable neuroma is identified.  No significant edema, erythema.  Muscular strength 5/5 in all groups tested bilateral.  Gait: Unassisted, Nonantalgic.       Assessment:   Metatarsalgia     Plan:  -Treatment options discussed including all alternatives, risks, and complications -Etiology of symptoms were discussed -X-rays were obtained and reviewed with the patient.  No definitive evidence of acute fracture. -Prescribed mobic. Discussed side effects of the medication and directed to stop if any are to occur and call the office.  -Dispensed offloading pads. -Can continue with massage gun. -Supportive shoe gear.  Trula Slade DPM

## 2021-09-30 DIAGNOSIS — N390 Urinary tract infection, site not specified: Secondary | ICD-10-CM | POA: Diagnosis not present

## 2021-09-30 DIAGNOSIS — R3129 Other microscopic hematuria: Secondary | ICD-10-CM | POA: Diagnosis not present

## 2021-10-04 DIAGNOSIS — M6281 Muscle weakness (generalized): Secondary | ICD-10-CM | POA: Diagnosis not present

## 2021-10-04 DIAGNOSIS — M25511 Pain in right shoulder: Secondary | ICD-10-CM | POA: Diagnosis not present

## 2021-10-15 DIAGNOSIS — R3129 Other microscopic hematuria: Secondary | ICD-10-CM | POA: Diagnosis not present

## 2021-10-15 DIAGNOSIS — Z87440 Personal history of urinary (tract) infections: Secondary | ICD-10-CM | POA: Diagnosis not present

## 2021-10-22 DIAGNOSIS — M25511 Pain in right shoulder: Secondary | ICD-10-CM | POA: Diagnosis not present

## 2021-10-22 DIAGNOSIS — M6281 Muscle weakness (generalized): Secondary | ICD-10-CM | POA: Diagnosis not present

## 2021-11-07 ENCOUNTER — Ambulatory Visit: Payer: Medicare HMO | Admitting: Podiatry

## 2021-11-07 DIAGNOSIS — M25511 Pain in right shoulder: Secondary | ICD-10-CM | POA: Diagnosis not present

## 2021-11-07 DIAGNOSIS — M6281 Muscle weakness (generalized): Secondary | ICD-10-CM | POA: Diagnosis not present

## 2021-11-07 DIAGNOSIS — L84 Corns and callosities: Secondary | ICD-10-CM | POA: Diagnosis not present

## 2021-11-10 NOTE — Progress Notes (Signed)
Subjective: 76 year old female presents the office today for new concerns of skin lesion on the right second toe between the toes.  Started about 3 months ago.  She is tried antibiotic ointment which did help some.  It has gotten better no pain currently but she knows it is going to come back.  She denies any fevers or chills or any swelling or redness.  Objective: AAO x3, NAD DP/PT pulses palpable bilaterally, CRT less than 3 seconds Hyperkeratotic lesion medial aspect of the left second toe on the IPJ without any underlying ulceration drainage or signs of infection.  No fluctuation or crepitation.  No malodor.  Controlled.   No pain with calf compression, swelling, warmth, erythema  Assessment: Preulcerative callus left second toe  Plan: -All treatment options discussed with the patient including all alternatives, risks, complications.  -Sharply debrided the skin lesion to any complications or bleeding.  Recommended offloading.  Dispensed toe separators. -Monitor for any clinical signs or symptoms of infection and directed to call the office immediately should any occur or go to the ER. -Patient encouraged to call the office with any questions, concerns, change in symptoms.

## 2021-11-15 DIAGNOSIS — M2669 Other specified disorders of temporomandibular joint: Secondary | ICD-10-CM | POA: Insufficient documentation

## 2021-11-15 DIAGNOSIS — H938X1 Other specified disorders of right ear: Secondary | ICD-10-CM | POA: Diagnosis not present

## 2021-11-15 DIAGNOSIS — R42 Dizziness and giddiness: Secondary | ICD-10-CM | POA: Diagnosis not present

## 2021-11-21 DIAGNOSIS — M6281 Muscle weakness (generalized): Secondary | ICD-10-CM | POA: Diagnosis not present

## 2021-11-21 DIAGNOSIS — M25511 Pain in right shoulder: Secondary | ICD-10-CM | POA: Diagnosis not present

## 2021-11-26 DIAGNOSIS — E7800 Pure hypercholesterolemia, unspecified: Secondary | ICD-10-CM | POA: Diagnosis not present

## 2021-11-26 DIAGNOSIS — Z6832 Body mass index (BMI) 32.0-32.9, adult: Secondary | ICD-10-CM | POA: Diagnosis not present

## 2021-11-26 DIAGNOSIS — Z23 Encounter for immunization: Secondary | ICD-10-CM | POA: Diagnosis not present

## 2021-11-26 DIAGNOSIS — I10 Essential (primary) hypertension: Secondary | ICD-10-CM | POA: Diagnosis not present

## 2021-12-03 DIAGNOSIS — M25511 Pain in right shoulder: Secondary | ICD-10-CM | POA: Diagnosis not present

## 2021-12-03 DIAGNOSIS — M6281 Muscle weakness (generalized): Secondary | ICD-10-CM | POA: Diagnosis not present

## 2021-12-24 DIAGNOSIS — M6281 Muscle weakness (generalized): Secondary | ICD-10-CM | POA: Diagnosis not present

## 2021-12-24 DIAGNOSIS — M25511 Pain in right shoulder: Secondary | ICD-10-CM | POA: Diagnosis not present

## 2022-01-07 DIAGNOSIS — H524 Presbyopia: Secondary | ICD-10-CM | POA: Diagnosis not present

## 2022-01-07 DIAGNOSIS — M6281 Muscle weakness (generalized): Secondary | ICD-10-CM | POA: Diagnosis not present

## 2022-01-07 DIAGNOSIS — M25511 Pain in right shoulder: Secondary | ICD-10-CM | POA: Diagnosis not present

## 2022-01-27 DIAGNOSIS — Z0184 Encounter for antibody response examination: Secondary | ICD-10-CM | POA: Diagnosis not present

## 2022-01-30 DIAGNOSIS — M6281 Muscle weakness (generalized): Secondary | ICD-10-CM | POA: Diagnosis not present

## 2022-01-30 DIAGNOSIS — M25511 Pain in right shoulder: Secondary | ICD-10-CM | POA: Diagnosis not present

## 2022-02-03 DIAGNOSIS — Z780 Asymptomatic menopausal state: Secondary | ICD-10-CM | POA: Diagnosis not present

## 2022-02-03 DIAGNOSIS — Z1231 Encounter for screening mammogram for malignant neoplasm of breast: Secondary | ICD-10-CM | POA: Diagnosis not present

## 2022-02-03 DIAGNOSIS — M8589 Other specified disorders of bone density and structure, multiple sites: Secondary | ICD-10-CM | POA: Diagnosis not present

## 2022-02-12 DIAGNOSIS — M6281 Muscle weakness (generalized): Secondary | ICD-10-CM | POA: Diagnosis not present

## 2022-02-12 DIAGNOSIS — M25511 Pain in right shoulder: Secondary | ICD-10-CM | POA: Diagnosis not present

## 2022-02-25 DIAGNOSIS — R42 Dizziness and giddiness: Secondary | ICD-10-CM | POA: Diagnosis not present

## 2022-02-25 DIAGNOSIS — H938X1 Other specified disorders of right ear: Secondary | ICD-10-CM | POA: Diagnosis not present

## 2022-02-26 DIAGNOSIS — M25511 Pain in right shoulder: Secondary | ICD-10-CM | POA: Diagnosis not present

## 2022-02-26 DIAGNOSIS — M6281 Muscle weakness (generalized): Secondary | ICD-10-CM | POA: Diagnosis not present

## 2022-03-03 DIAGNOSIS — H8113 Benign paroxysmal vertigo, bilateral: Secondary | ICD-10-CM | POA: Diagnosis not present

## 2022-03-10 DIAGNOSIS — H8113 Benign paroxysmal vertigo, bilateral: Secondary | ICD-10-CM | POA: Diagnosis not present

## 2022-03-18 DIAGNOSIS — M25511 Pain in right shoulder: Secondary | ICD-10-CM | POA: Diagnosis not present

## 2022-03-18 DIAGNOSIS — M6281 Muscle weakness (generalized): Secondary | ICD-10-CM | POA: Diagnosis not present

## 2022-03-24 DIAGNOSIS — H8113 Benign paroxysmal vertigo, bilateral: Secondary | ICD-10-CM | POA: Diagnosis not present

## 2022-03-31 DIAGNOSIS — H8113 Benign paroxysmal vertigo, bilateral: Secondary | ICD-10-CM | POA: Diagnosis not present

## 2022-04-01 DIAGNOSIS — M6281 Muscle weakness (generalized): Secondary | ICD-10-CM | POA: Diagnosis not present

## 2022-04-01 DIAGNOSIS — M25511 Pain in right shoulder: Secondary | ICD-10-CM | POA: Diagnosis not present

## 2022-04-08 DIAGNOSIS — H8113 Benign paroxysmal vertigo, bilateral: Secondary | ICD-10-CM | POA: Diagnosis not present

## 2022-04-16 DIAGNOSIS — M25511 Pain in right shoulder: Secondary | ICD-10-CM | POA: Diagnosis not present

## 2022-04-16 DIAGNOSIS — M6281 Muscle weakness (generalized): Secondary | ICD-10-CM | POA: Diagnosis not present

## 2022-05-07 DIAGNOSIS — D171 Benign lipomatous neoplasm of skin and subcutaneous tissue of trunk: Secondary | ICD-10-CM | POA: Diagnosis not present

## 2022-05-07 DIAGNOSIS — L570 Actinic keratosis: Secondary | ICD-10-CM | POA: Diagnosis not present

## 2022-05-07 DIAGNOSIS — L578 Other skin changes due to chronic exposure to nonionizing radiation: Secondary | ICD-10-CM | POA: Diagnosis not present

## 2022-05-07 DIAGNOSIS — D0462 Carcinoma in situ of skin of left upper limb, including shoulder: Secondary | ICD-10-CM | POA: Diagnosis not present

## 2022-05-07 DIAGNOSIS — L821 Other seborrheic keratosis: Secondary | ICD-10-CM | POA: Diagnosis not present

## 2022-05-07 DIAGNOSIS — L814 Other melanin hyperpigmentation: Secondary | ICD-10-CM | POA: Diagnosis not present

## 2022-05-07 DIAGNOSIS — Z85828 Personal history of other malignant neoplasm of skin: Secondary | ICD-10-CM | POA: Diagnosis not present

## 2022-05-07 DIAGNOSIS — Z86018 Personal history of other benign neoplasm: Secondary | ICD-10-CM | POA: Diagnosis not present

## 2022-05-07 DIAGNOSIS — D485 Neoplasm of uncertain behavior of skin: Secondary | ICD-10-CM | POA: Diagnosis not present

## 2022-05-07 DIAGNOSIS — D225 Melanocytic nevi of trunk: Secondary | ICD-10-CM | POA: Diagnosis not present

## 2022-05-13 DIAGNOSIS — M25511 Pain in right shoulder: Secondary | ICD-10-CM | POA: Diagnosis not present

## 2022-05-13 DIAGNOSIS — M6281 Muscle weakness (generalized): Secondary | ICD-10-CM | POA: Diagnosis not present

## 2022-05-21 DIAGNOSIS — M8580 Other specified disorders of bone density and structure, unspecified site: Secondary | ICD-10-CM | POA: Diagnosis not present

## 2022-05-21 DIAGNOSIS — Z0000 Encounter for general adult medical examination without abnormal findings: Secondary | ICD-10-CM | POA: Diagnosis not present

## 2022-05-21 DIAGNOSIS — Z23 Encounter for immunization: Secondary | ICD-10-CM | POA: Diagnosis not present

## 2022-05-21 DIAGNOSIS — Z6831 Body mass index (BMI) 31.0-31.9, adult: Secondary | ICD-10-CM | POA: Diagnosis not present

## 2022-05-21 DIAGNOSIS — E6609 Other obesity due to excess calories: Secondary | ICD-10-CM | POA: Diagnosis not present

## 2022-05-29 DIAGNOSIS — M25511 Pain in right shoulder: Secondary | ICD-10-CM | POA: Diagnosis not present

## 2022-05-29 DIAGNOSIS — M6281 Muscle weakness (generalized): Secondary | ICD-10-CM | POA: Diagnosis not present

## 2022-06-03 DIAGNOSIS — D0462 Carcinoma in situ of skin of left upper limb, including shoulder: Secondary | ICD-10-CM | POA: Diagnosis not present

## 2022-06-17 DIAGNOSIS — M25511 Pain in right shoulder: Secondary | ICD-10-CM | POA: Diagnosis not present

## 2022-06-17 DIAGNOSIS — M6281 Muscle weakness (generalized): Secondary | ICD-10-CM | POA: Diagnosis not present

## 2022-07-01 DIAGNOSIS — Z5189 Encounter for other specified aftercare: Secondary | ICD-10-CM | POA: Diagnosis not present

## 2022-07-15 DIAGNOSIS — M25511 Pain in right shoulder: Secondary | ICD-10-CM | POA: Diagnosis not present

## 2022-07-15 DIAGNOSIS — M6281 Muscle weakness (generalized): Secondary | ICD-10-CM | POA: Diagnosis not present

## 2022-07-22 DIAGNOSIS — Z85828 Personal history of other malignant neoplasm of skin: Secondary | ICD-10-CM | POA: Diagnosis not present

## 2022-07-22 DIAGNOSIS — Z5189 Encounter for other specified aftercare: Secondary | ICD-10-CM | POA: Diagnosis not present

## 2022-08-06 DIAGNOSIS — L570 Actinic keratosis: Secondary | ICD-10-CM | POA: Diagnosis not present

## 2022-08-06 DIAGNOSIS — D171 Benign lipomatous neoplasm of skin and subcutaneous tissue of trunk: Secondary | ICD-10-CM | POA: Diagnosis not present

## 2022-08-06 DIAGNOSIS — L578 Other skin changes due to chronic exposure to nonionizing radiation: Secondary | ICD-10-CM | POA: Diagnosis not present

## 2022-12-09 DIAGNOSIS — S70361A Insect bite (nonvenomous), right thigh, initial encounter: Secondary | ICD-10-CM | POA: Diagnosis not present

## 2022-12-09 DIAGNOSIS — M8580 Other specified disorders of bone density and structure, unspecified site: Secondary | ICD-10-CM | POA: Diagnosis not present

## 2022-12-09 DIAGNOSIS — Z23 Encounter for immunization: Secondary | ICD-10-CM | POA: Diagnosis not present

## 2022-12-09 DIAGNOSIS — I10 Essential (primary) hypertension: Secondary | ICD-10-CM | POA: Diagnosis not present

## 2022-12-09 DIAGNOSIS — E7800 Pure hypercholesterolemia, unspecified: Secondary | ICD-10-CM | POA: Diagnosis not present

## 2022-12-12 IMAGING — CT CT ABD-PELV W/ CM
1 of 3 series · 13 of 32 positions shown, 19 images · IV contrast (APPLIED)
Comparison: None.

CLINICAL DATA: Right upper quadrant pain

EXAM:
CT ABDOMEN AND PELVIS WITH CONTRAST
TECHNIQUE: Multidetector CT imaging of the abdomen and pelvis was performed
using the standard protocol following bolus administration of
intravenous contrast.
CONTRAST:  100mL OOS291-COO IOPAMIDOL (OOS291-COO) INJECTION 61%

[Series 2: abd/pelvis w/cm · axial · 0.83mm/px · z∈[-427,-67]mm · 13 of 86 slices shown, 19 images]
[im 7/86  soft-tissue]
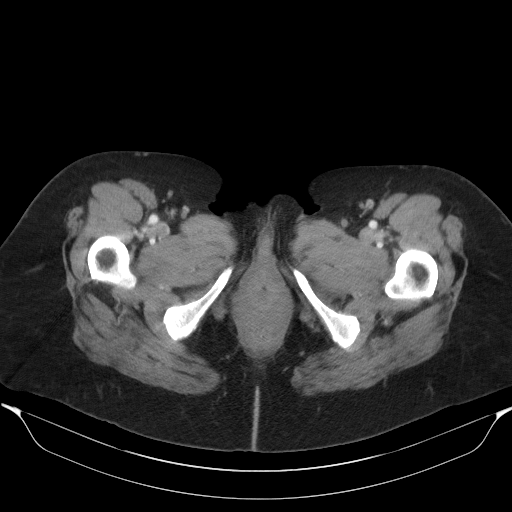
[im 7/86  bone]
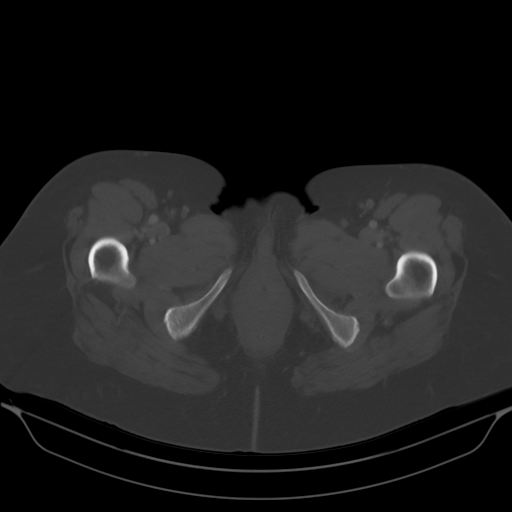
[im 13/86  soft-tissue]
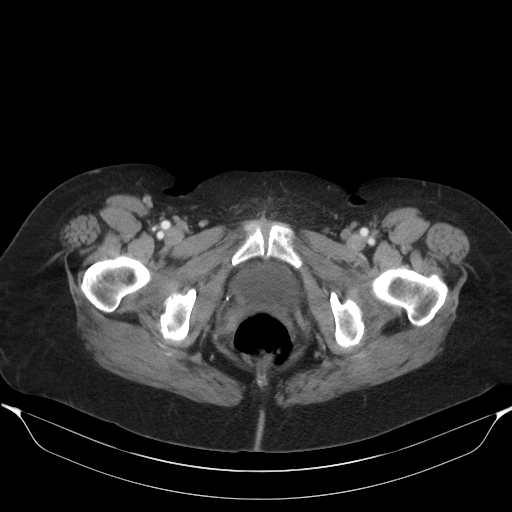
[im 19/86  soft-tissue]
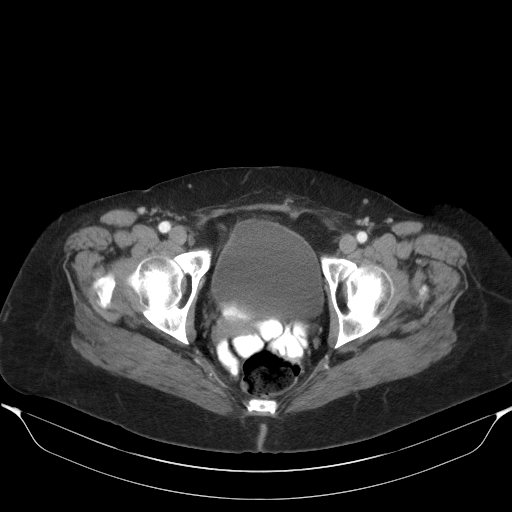
[im 25/86  soft-tissue]
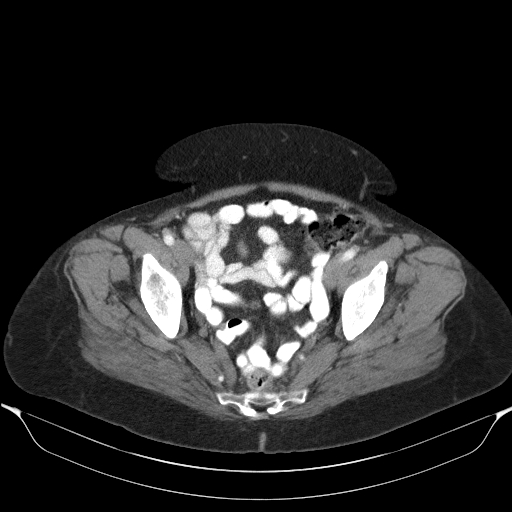
[im 31/86  soft-tissue]
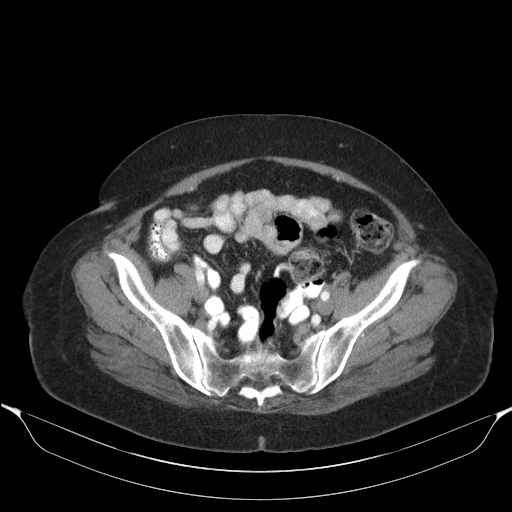
[im 37/86  soft-tissue]
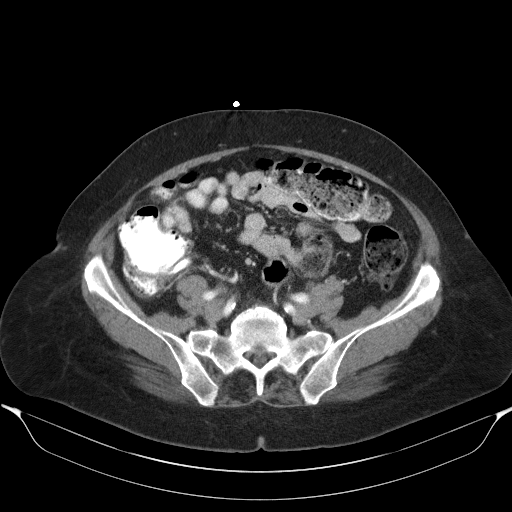
[im 43/86  soft-tissue]
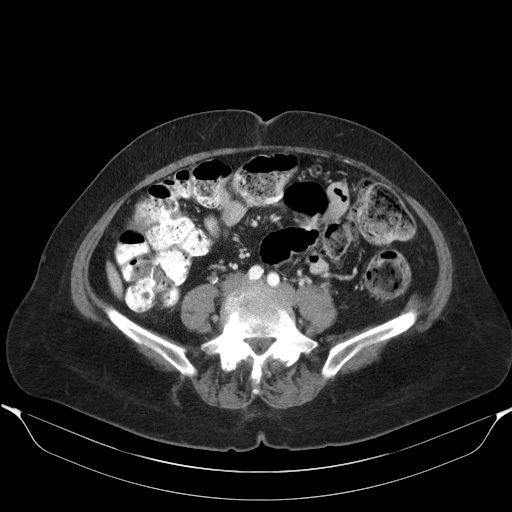
[im 49/86  soft-tissue]
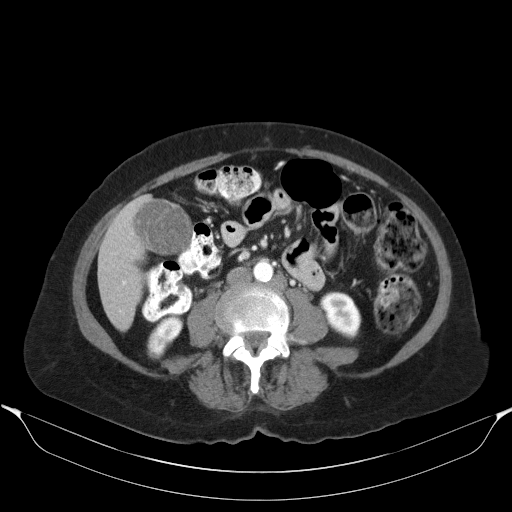
[im 55/86  soft-tissue]
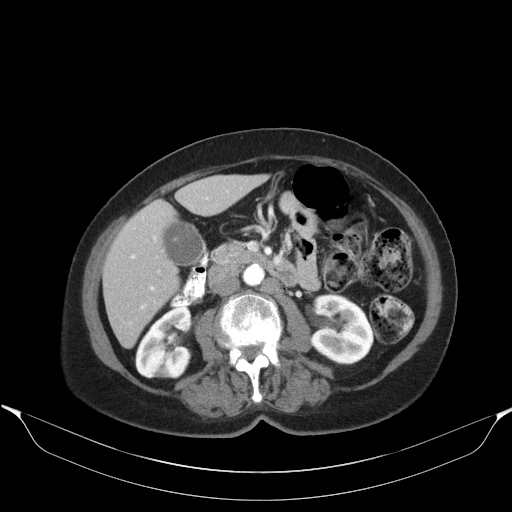
[im 55/86  bone]
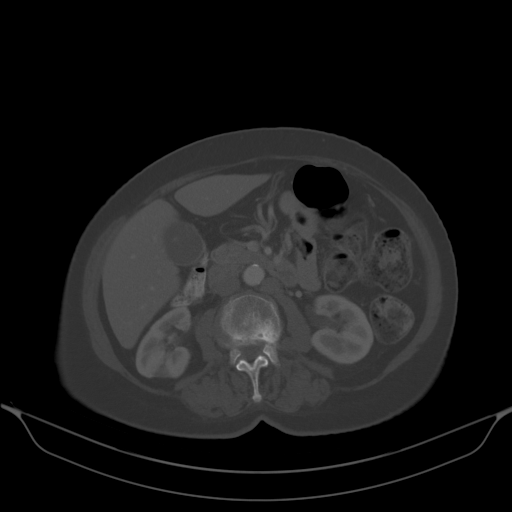
[im 61/86  soft-tissue]
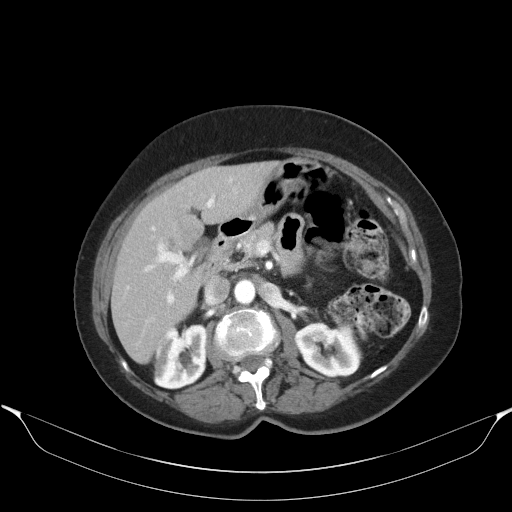
[im 61/86  lung]
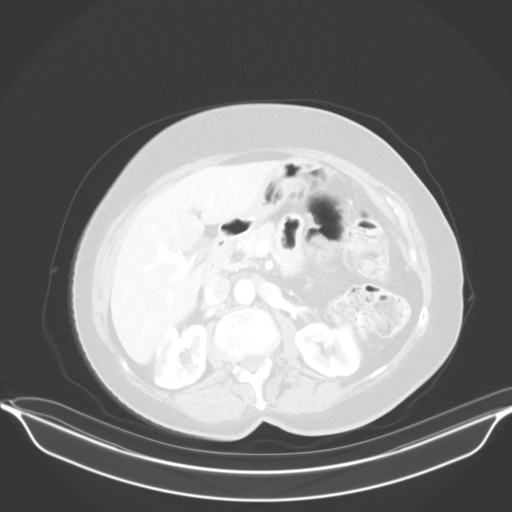
[im 67/86  soft-tissue]
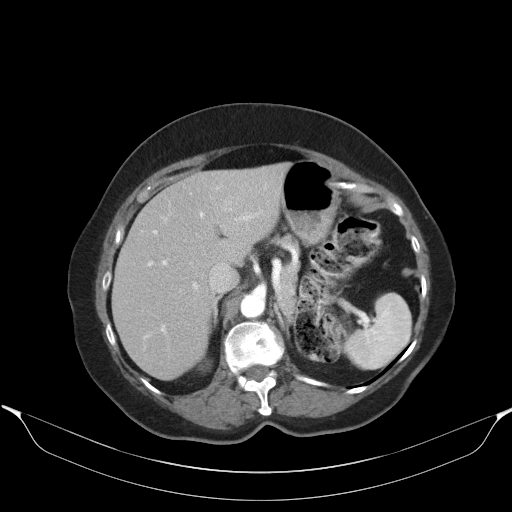
[im 67/86  lung]
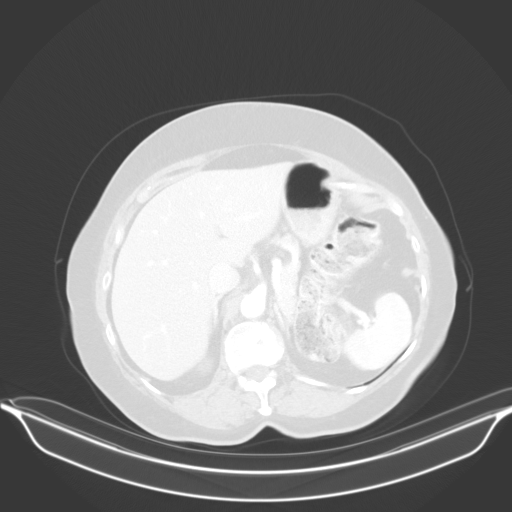
[im 73/86  soft-tissue]
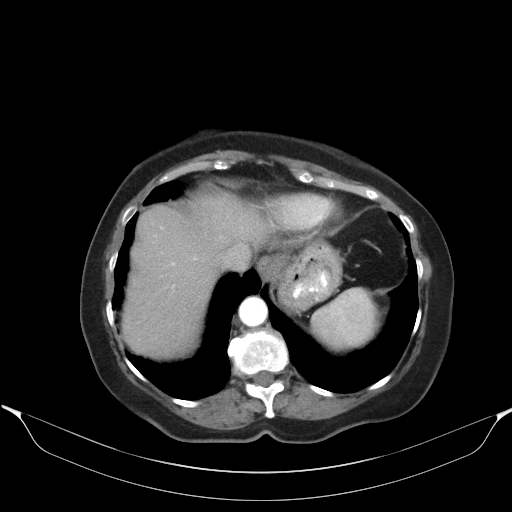
[im 73/86  lung]
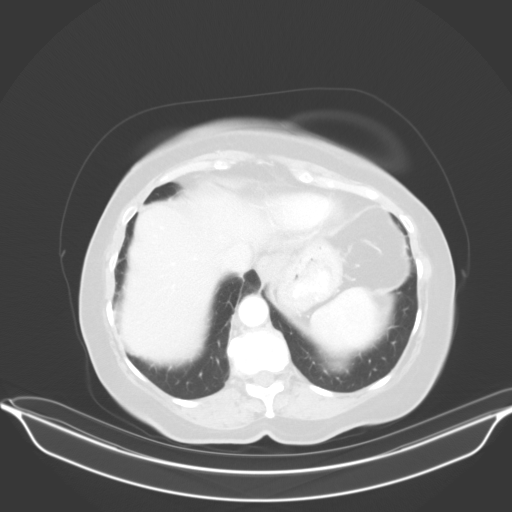
[im 79/86  soft-tissue]
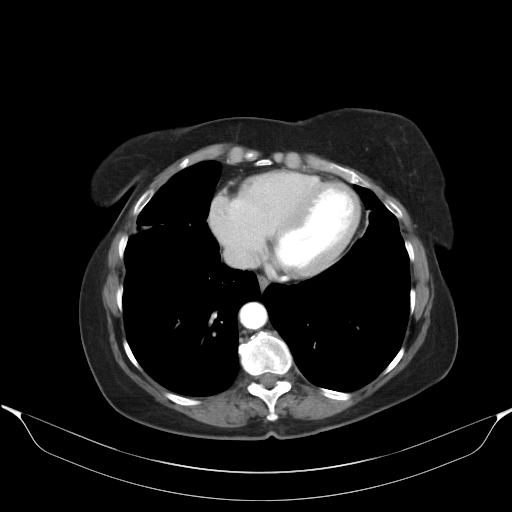
[im 79/86  lung]
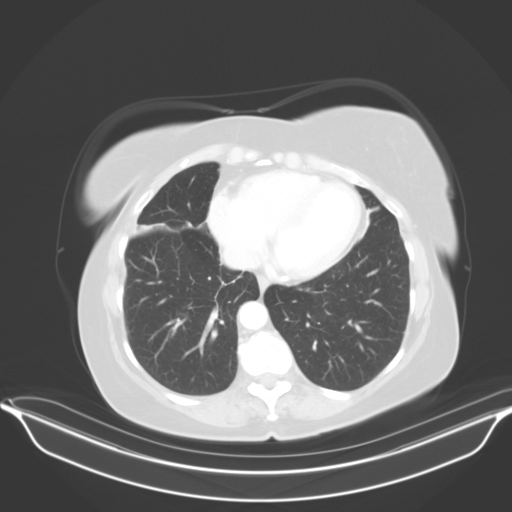

[13 of 32 positions shown; findings below may reference images not displayed]

FINDINGS: Lower chest: Scarring in the lung bases. No effusions or acute
abnormality.

Hepatobiliary: Multiple gallstones within the gallbladder. No
biliary ductal dilatation. No focal hepatic abnormality.

Pancreas: No focal abnormality or ductal dilatation.

Spleen: No focal abnormality.  Normal size.

Adrenals/Urinary Tract: No renal or adrenal mass. No hydronephrosis.
Areas of cortical thinning and scarring in the right kidney. Urinary
bladder unremarkable.

Stomach/Bowel: Large stool burden throughout the colon. Stomach,
large and small bowel grossly unremarkable.

Vascular/Lymphatic: No evidence of aneurysm or adenopathy.

Reproductive: Uterus and adnexa unremarkable.  No mass.

Other: No free fluid or free air.

Musculoskeletal: No acute bony abnormality.
IMPRESSION: Cholelithiasis.  No CT evidence of acute cholecystitis.

Large stool burden throughout the colon suggest constipation.

Bibasilar scarring.

No acute findings.

## 2023-02-09 DIAGNOSIS — Z1231 Encounter for screening mammogram for malignant neoplasm of breast: Secondary | ICD-10-CM | POA: Diagnosis not present

## 2023-02-11 DIAGNOSIS — D0462 Carcinoma in situ of skin of left upper limb, including shoulder: Secondary | ICD-10-CM | POA: Diagnosis not present

## 2023-03-30 DIAGNOSIS — C44629 Squamous cell carcinoma of skin of left upper limb, including shoulder: Secondary | ICD-10-CM | POA: Diagnosis not present

## 2023-04-14 DIAGNOSIS — H5213 Myopia, bilateral: Secondary | ICD-10-CM | POA: Diagnosis not present

## 2023-05-06 DIAGNOSIS — D0462 Carcinoma in situ of skin of left upper limb, including shoulder: Secondary | ICD-10-CM | POA: Diagnosis not present

## 2023-05-20 DIAGNOSIS — C4401 Basal cell carcinoma of skin of lip: Secondary | ICD-10-CM | POA: Diagnosis not present

## 2023-05-20 DIAGNOSIS — D171 Benign lipomatous neoplasm of skin and subcutaneous tissue of trunk: Secondary | ICD-10-CM | POA: Diagnosis not present

## 2023-05-20 DIAGNOSIS — D485 Neoplasm of uncertain behavior of skin: Secondary | ICD-10-CM | POA: Diagnosis not present

## 2023-05-20 DIAGNOSIS — L578 Other skin changes due to chronic exposure to nonionizing radiation: Secondary | ICD-10-CM | POA: Diagnosis not present

## 2023-05-20 DIAGNOSIS — C4441 Basal cell carcinoma of skin of scalp and neck: Secondary | ICD-10-CM | POA: Diagnosis not present

## 2023-05-20 DIAGNOSIS — Z85828 Personal history of other malignant neoplasm of skin: Secondary | ICD-10-CM | POA: Diagnosis not present

## 2023-05-20 DIAGNOSIS — L814 Other melanin hyperpigmentation: Secondary | ICD-10-CM | POA: Diagnosis not present

## 2023-05-20 DIAGNOSIS — L570 Actinic keratosis: Secondary | ICD-10-CM | POA: Diagnosis not present

## 2023-05-20 DIAGNOSIS — L821 Other seborrheic keratosis: Secondary | ICD-10-CM | POA: Diagnosis not present

## 2023-05-20 DIAGNOSIS — D225 Melanocytic nevi of trunk: Secondary | ICD-10-CM | POA: Diagnosis not present

## 2023-05-20 DIAGNOSIS — Z86018 Personal history of other benign neoplasm: Secondary | ICD-10-CM | POA: Diagnosis not present

## 2023-06-22 DIAGNOSIS — I10 Essential (primary) hypertension: Secondary | ICD-10-CM | POA: Diagnosis not present

## 2023-06-24 DIAGNOSIS — C4441 Basal cell carcinoma of skin of scalp and neck: Secondary | ICD-10-CM | POA: Diagnosis not present

## 2023-06-24 DIAGNOSIS — C4401 Basal cell carcinoma of skin of lip: Secondary | ICD-10-CM | POA: Diagnosis not present

## 2023-07-13 DIAGNOSIS — I10 Essential (primary) hypertension: Secondary | ICD-10-CM | POA: Diagnosis not present

## 2023-08-10 DIAGNOSIS — R300 Dysuria: Secondary | ICD-10-CM | POA: Diagnosis not present

## 2023-08-24 ENCOUNTER — Encounter: Payer: Self-pay | Admitting: Podiatry

## 2023-08-24 ENCOUNTER — Ambulatory Visit: Admitting: Podiatry

## 2023-08-24 ENCOUNTER — Ambulatory Visit (INDEPENDENT_AMBULATORY_CARE_PROVIDER_SITE_OTHER)

## 2023-08-24 DIAGNOSIS — M7751 Other enthesopathy of right foot: Secondary | ICD-10-CM | POA: Diagnosis not present

## 2023-08-24 DIAGNOSIS — M722 Plantar fascial fibromatosis: Secondary | ICD-10-CM

## 2023-08-24 DIAGNOSIS — M7731 Calcaneal spur, right foot: Secondary | ICD-10-CM | POA: Diagnosis not present

## 2023-08-24 MED ORDER — TRIAMCINOLONE ACETONIDE 10 MG/ML IJ SUSP
5.0000 mg | Freq: Once | INTRAMUSCULAR | Status: AC
  Administered 2023-08-24: 5 mg via INTRAMUSCULAR

## 2023-08-24 MED ORDER — MELOXICAM 7.5 MG PO TABS: 7.5000 mg | ORAL_TABLET | Freq: Every day | ORAL | 0 refills | Status: AC | PRN

## 2023-08-24 NOTE — Patient Instructions (Addendum)
For instructions on how to put on your Plantar Fascial Brace, please visit [email or web address]  --  While at your visit today you received a steroid injection in your foot or ankle to help with your pain. Along with having the steroid medication there is some "numbing" medication in the shot that you received. Due to this you may notice some numbness to the area for the next couple of hours.   I would recommend limiting activity for the next few days to help the steroid injection take affect.    The actually benefit from the steroid injection may take up to 2-7 days to see a difference. You may actually experience a small (as in 10%) INCREASE in pain in the first 24 hours---that is common. It would be best if you can ice the area today and take anti-inflammatory medications (such as Ibuprofen, Motrin, or Aleve) if you are able to take these medications. If you were prescribed another medication to help with the pain go ahead and start that medication today    Things to watch out for that you should contact us or a health care provider urgently would include: 1. Unusual (as in more than 10%) increase in pain 2. New fever > 101.5 3. New swelling or redness of the injected area.  4. Streaking of red lines around the area injected.  If you have any questions or concerns about this, please give our office a call at [phone].    ----   Plantar Fasciitis (Heel Spur Syndrome) with Rehab The plantar fascia is a fibrous, ligament-like, soft-tissue structure that spans the bottom of the foot. Plantar fasciitis is a condition that causes pain in the foot due to inflammation of the tissue. SYMPTOMS  Pain and tenderness on the underneath side of the foot. Pain that worsens with standing or walking. CAUSES  Plantar fasciitis is caused by irritation and injury to the plantar fascia on the underneath side of the foot. Common mechanisms of injury include: Direct trauma to bottom of the  foot. Damage to a small nerve that runs under the foot where the main fascia attaches to the heel bone. Stress placed on the plantar fascia due to bone spurs. RISK INCREASES WITH:  Activities that place stress on the plantar fascia (running, jumping, pivoting, or cutting). Poor strength and flexibility. Improperly fitted shoes. Tight calf muscles. Flat feet. Failure to warm-up properly before activity. Obesity. PREVENTION Warm up and stretch properly before activity. Allow for adequate recovery between workouts. Maintain physical fitness: Strength, flexibility, and endurance. Cardiovascular fitness. Maintain a health body weight. Avoid stress on the plantar fascia. Wear properly fitted shoes, including arch supports for individuals who have flat feet.  PROGNOSIS  If treated properly, then the symptoms of plantar fasciitis usually resolve without surgery. However, occasionally surgery is necessary.  RELATED COMPLICATIONS  Recurrent symptoms that may result in a chronic condition. Problems of the lower back that are caused by compensating for the injury, such as limping. Pain or weakness of the foot during push-off following surgery. Chronic inflammation, scarring, and partial or complete fascia tear, occurring more often from repeated injections.  TREATMENT  Treatment initially involves the use of ice and medication to help reduce pain and inflammation. The use of strengthening and stretching exercises may help reduce pain with activity, especially stretches of the Achilles tendon. These exercises may be performed at home or with a therapist. Your caregiver may recommend that you use heel cups of arch supports to help reduce stress on   the plantar fascia. Occasionally, corticosteroid injections are given to reduce inflammation. If symptoms persist for greater than 6 months despite non-surgical (conservative), then surgery may be recommended.   MEDICATION  If pain medication is  necessary, then nonsteroidal anti-inflammatory medications, such as aspirin and ibuprofen, or other minor pain relievers, such as acetaminophen, are often recommended. Do not take pain medication within 7 days before surgery. Prescription pain relievers may be given if deemed necessary by your caregiver. Use only as directed and only as much as you need. Corticosteroid injections may be given by your caregiver. These injections should be reserved for the most serious cases, because they may only be given a certain number of times.  HEAT AND COLD Cold treatment (icing) relieves pain and reduces inflammation. Cold treatment should be applied for 10 to 15 minutes every 2 to 3 hours for inflammation and pain and immediately after any activity that aggravates your symptoms. Use ice packs or massage the area with a piece of ice (ice massage). Heat treatment may be used prior to performing the stretching and strengthening activities prescribed by your caregiver, physical therapist, or athletic trainer. Use a heat pack or soak the injury in warm water.  SEEK IMMEDIATE MEDICAL CARE IF: Treatment seems to offer no benefit, or the condition worsens. Any medications produce adverse side effects.  EXERCISES- RANGE OF MOTION (ROM) AND STRETCHING EXERCISES - Plantar Fasciitis (Heel Spur Syndrome) These exercises may help you when beginning to rehabilitate your injury. Your symptoms may resolve with or without further involvement from your physician, physical therapist or athletic trainer. While completing these exercises, remember:  Restoring tissue flexibility helps normal motion to return to the joints. This allows healthier, less painful movement and activity. An effective stretch should be held for at least 30 seconds. A stretch should never be painful. You should only feel a gentle lengthening or release in the stretched tissue.  RANGE OF MOTION - Toe Extension, Flexion Sit with your right / left leg  crossed over your opposite knee. Grasp your toes and gently pull them back toward the top of your foot. You should feel a stretch on the bottom of your toes and/or foot. Hold this stretch for 10 seconds. Now, gently pull your toes toward the bottom of your foot. You should feel a stretch on the top of your toes and or foot. Hold this stretch for 10 seconds. Repeat  times. Complete this stretch 3 times per day.   RANGE OF MOTION - Ankle Dorsiflexion, Active Assisted Remove shoes and sit on a chair that is preferably not on a carpeted surface. Place right / left foot under knee. Extend your opposite leg for support. Keeping your heel down, slide your right / left foot back toward the chair until you feel a stretch at your ankle or calf. If you do not feel a stretch, slide your bottom forward to the edge of the chair, while Lindsay Peck keeping your heel down. Hold this stretch for 10 seconds. Repeat 3 times. Complete this stretch 2 times per day.   STRETCH  Gastroc, Standing Place hands on wall. Extend right / left leg, keeping the front knee somewhat bent. Slightly point your toes inward on your back foot. Keeping your right / left heel on the floor and your knee straight, shift your weight toward the wall, not allowing your back to arch. You should feel a gentle stretch in the right / left calf. Hold this position for 10 seconds. Repeat 3 times. Complete this   stretch 2 times per day.  STRETCH  Soleus, Standing Place hands on wall. Extend right / left leg, keeping the other knee somewhat bent. Slightly point your toes inward on your back foot. Keep your right / left heel on the floor, bend your back knee, and slightly shift your weight over the back leg so that you feel a gentle stretch deep in your back calf. Hold this position for 10 seconds. Repeat 3 times. Complete this stretch 2 times per day.  STRETCH  Gastrocsoleus, Standing  Note: This exercise can place a lot of stress on your foot  and ankle. Please complete this exercise only if specifically instructed by your caregiver.  Place the ball of your right / left foot on a step, keeping your other foot firmly on the same step. Hold on to the wall or a rail for balance. Slowly lift your other foot, allowing your body weight to press your heel down over the edge of the step. You should feel a stretch in your right / left calf. Hold this position for 10 seconds. Repeat this exercise with a slight bend in your right / left knee. Repeat 3 times. Complete this stretch 2 times per day.   STRENGTHENING EXERCISES - Plantar Fasciitis (Heel Spur Syndrome)  These exercises may help you when beginning to rehabilitate your injury. They may resolve your symptoms with or without further involvement from your physician, physical therapist or athletic trainer. While completing these exercises, remember:  Muscles can gain both the endurance and the strength needed for everyday activities through controlled exercises. Complete these exercises as instructed by your physician, physical therapist or athletic trainer. Progress the resistance and repetitions only as guided.  STRENGTH - Towel Curls Sit in a chair positioned on a non-carpeted surface. Place your foot on a towel, keeping your heel on the floor. Pull the towel toward your heel by only curling your toes. Keep your heel on the floor. Repeat 3 times. Complete this exercise 2 times per day.  STRENGTH - Ankle Inversion Secure one end of a rubber exercise band/tubing to a fixed object (table, pole). Loop the other end around your foot just before your toes. Place your fists between your knees. This will focus your strengthening at your ankle. Slowly, pull your big toe up and in, making sure the band/tubing is positioned to resist the entire motion. Hold this position for 10 seconds. Have your muscles resist the band/tubing as it slowly pulls your foot back to the starting position. Repeat 3  times. Complete this exercises 2 times per day.  Document Released: 04/14/2005 Document Revised: 07/07/2011 Document Reviewed: 07/27/2008 [employer] Patient Information 2014 [employer].  

## 2023-08-24 NOTE — Progress Notes (Signed)
 Subjective:  Patient ID: Lindsay Peck, female    DOB: 1946/02/27,  MRN: [id number]  Chief Complaint  Patient presents with   Plantar Fasciitis    RM#13 Right foot pain ongoing for 6 weeks started after walking a lot while on vacation.    Discussed the use of AI scribe software for clinical note transcription with the patient, who gave verbal consent to proceed.  History of Present Illness The patient presents with right foot pain that started about six weeks ago. The onset of the pain coincided with a trip to [place] where she was walking on cobblestone streets. She was wearing her [employer] shoes at the time and thought she would be fine, but the pain persisted. The pain is located mainly on the side of her heel and is most bothersome when she gets up and walks. She has tried home remedies such as icing and stretching, but the pain has not improved. The patient notes that she walks differently with one foot than the other due to her previous knee surgery.     Objective:    Physical Exam General: AAO x3, NAD  Dermatological: Skin is warm, dry and supple bilateral.  There are no open sores, no preulcerative lesions, no rash or signs of infection present.  Vascular: Dorsalis Pedis artery and Posterior Tibial artery pedal pulses are 2/4 bilateral with immedate capillary fill time. There is no pain with calf compression, swelling, warmth, erythema.   Neruologic: Grossly intact via light touch bilateral.  Negative Tennille sign.  Musculoskeletal: Tenderness to palpation along the plantar medial tubercle of the calcaneus at the insertion of plantar fascia on the right foot. There is no pain along the course of the plantar fascia within the arch of the foot. Plantar fascia appears to be intact. There is no pain with lateral compression of the calcaneus or pain with vibratory sensation. There is no pain along the course or insertion of the achilles tendon. No other areas of tenderness to bilateral  lower extremities.        Results   RADIOLOGY Foot X-ray: Calcaneal spur, arthritic changes are present in the midfoot on the first metatarsal cuneiform joint, no other significant abnormalities (08/24/2023)   Assessment:   1. Heel spur, right   2. Plantar fasciitis      Plan:  Patient was evaluated and treated and all questions answered.  Assessment and Plan Assessment & Plan Plantar fasciitis with heel spur - Continue icing heel daily. - Prescribe meloxicam  short-term for inflammation. Prescribed mobic . Discussed side effects of the medication and directed to stop if any are to occur and call the office.  - Administer steroid injection into heel.  See procedure note below. - Provide exercises to stretch leg and foot. - Recommend plantar fascia brace was dispensed to help support and stabilize the plantar fascia to facilitate healing - Advise supportive shoes and over-the-counter insoles. - Discuss custom orthotics; insurance may not cover. - Encourage modified aerobic exercises, avoid high-impact. - Suggest Voltaren gel as alternative to oral anti-inflammatories if she wants an oral medication. - Advise consistent daily stretching and icing.  Procedure: Injection Tendon/Ligament Discussed alternatives, risks, complications and verbal consent was obtained.  Location: Right plantar fascia at the glabrous junction; medial approach. Skin Prep: Alcohol. Injectate: 0.5cc 0.5% marcaine  plain, 0.5 cc 2% lidocaine  plain and, 1 cc kenalog 10. Disposition: Patient tolerated procedure well. Injection site dressed with a band-aid.  Post-injection care was discussed and return precautions discussed.   No follow-ups  on file.   Charity Conch DPM

## 2023-08-26 ENCOUNTER — Telehealth: Payer: Self-pay | Admitting: Podiatry

## 2023-08-26 NOTE — Telephone Encounter (Signed)
 The recommendations for the orthotics/ insoles OTC that was better than the Dr. Randon Butters, did not print w/ the AVS. Please advise on what they were and I can call her back to give her information.

## 2023-08-28 ENCOUNTER — Encounter: Payer: Self-pay | Admitting: Podiatry

## 2023-08-28 ENCOUNTER — Ambulatory Visit: Admitting: Podiatry

## 2023-08-28 DIAGNOSIS — M629 Disorder of muscle, unspecified: Secondary | ICD-10-CM | POA: Diagnosis not present

## 2023-08-28 MED ORDER — METHYLPREDNISOLONE 4 MG PO TBPK: ORAL_TABLET | ORAL | 0 refills | Status: AC

## 2023-08-28 NOTE — Progress Notes (Deleted)
   Subjective:    Patient ID: Lindsay Peck, female    DOB: [birth year], 78 y.o.   MRN: [id number]  HPI    Review of Systems     Objective:   Physical Exam        Assessment & Plan:

## 2023-08-28 NOTE — Addendum Note (Signed)
 Addended by: Damon Baisch on: 08/28/2023 10:36 AM   Modules accepted: Orders

## 2023-08-28 NOTE — Progress Notes (Signed)
 Subjective:  Patient ID: Lindsay Peck, female    DOB: [birth year],  MRN: [id number]  Chief Complaint  Patient presents with   Foot Injury    Medial foot right - stepped off curb yesterday leaving the vet with her dog and felt a sharp pain radiate up medial foot and ankle, she says she didn't roll or twist the foot, no swelling or discoloration, daughter gave her a boot to wear and she has been staying off of it    78 y.o. female presents with the above complaint.  Patient presents with right concern for possible plantar fascial tear.  She states she stepped off a curb yesterday with her dog and fell to the pop/pain of the foot and ankle lying on her causes her a lot of pain and discoloration she is not currently in a boot that she borrowed from her daughter denies any other acute complaints would like to discuss next treatment plan.   Review of Systems: Negative except as noted in the HPI. Denies N/V/F/Ch.  Past Medical History:  Diagnosis Date   Arthritis    "In hand"   Cancer (HCC)    Precancerous on hand (Bilaterally)   Hypertension    Right arm fracture    Urinary retention     Current Outpatient Medications:    Ascorbic Acid (VITAMIN C) 1000 MG tablet, Take 1,000 mg by mouth daily., Disp: , Rfl:    aspirin EC 81 MG tablet, Take 81 mg by mouth daily. Stopped since 07-15-2019 surgery, Disp: , Rfl:    atorvastatin  (LIPITOR) 10 MG tablet, Take 10 mg by mouth daily., Disp: , Rfl:    Calcium  Carbonate-Vit D-Min (CALCIUM  1200 PO), Take by mouth. 2 pills daily, Disp: , Rfl:    ciprofloxacin  (CIPRO ) 500 MG tablet, Take 1 tablet (500 mg total) by mouth 2 (two) times daily. (Patient not taking: Reported on 08/24/2023), Disp: 6 tablet, Rfl: 0   COD LIVER OIL PO, Take by mouth. 415 mg 2 pills daily, Disp: , Rfl:    docusate sodium  (COLACE) 100 MG capsule, Take 100 mg by mouth daily as needed for mild constipation., Disp: , Rfl:    estradiol  (ESTRACE  VAGINAL) 0.1 MG/GM vaginal cream, Place 1  Applicatorful vaginally 3 (three) times a week. Use one small pea sized dolyp on tip of finger and swap just inside the vagina every other day., Disp: 42.5 g, Rfl: 12   Ginger, Zingiber officinalis, (GINGER ROOT) 550 MG CAPS, Take by mouth daily., Disp: , Rfl:    hydrochlorothiazide  (HYDRODIURIL ) 25 MG tablet, Take 25 mg by mouth daily. , Disp: , Rfl: 1   meloxicam  (MOBIC ) 7.5 MG tablet, Take 1 tablet (7.5 mg total) by mouth daily as needed for pain., Disp: 14 tablet, Rfl: 0   metroNIDAZOLE (FLAGYL) 500 MG tablet, Take 500 mg by mouth 3 (three) times daily. Finishes Monday 08-08-2019, Disp: , Rfl:    OVER THE COUNTER MEDICATION, Glucosamine 2000 mg 2 pills daily, Disp: , Rfl:    polyethylene glycol (MIRALAX / GLYCOLAX) 17 g packet, Take 17 g by mouth daily., Disp: , Rfl:    Probiotic Product (PROBIOTIC DAILY PO), Take 1 tablet by mouth daily. 100 Million organism, Disp: , Rfl:    traMADol  (ULTRAM ) 50 MG tablet, Take 1-2 tablets (50-100 mg total) by mouth every 6 (six) hours as needed for moderate pain or severe pain., Disp: 20 tablet, Rfl: 0   Turmeric (QC TUMERIC COMPLEX) 500 MG CAPS, Take by mouth daily., Disp: ,  Rfl:    UNABLE TO FIND, Med Name:vitamin d 3 5000 iu daily, Disp: , Rfl:   Social History   Tobacco Use  Smoking Status Never  Smokeless Tobacco Never    Allergies  Allergen Reactions   Codeine Nausea Only   Macrolides And Ketolides Other (See Comments)   Penicillin G Other (See Comments)   Penicillins Other (See Comments)    Did it involve swelling of the face/tongue/throat, SOB, or low BP? Unknown Did it involve sudden or severe rash/hives, skin peeling, or any reaction on the inside of your mouth or nose? Unknown Did you need to seek medical attention at a hospital or doctor's office? Unknown When did it last happen?      Childhood If all above answers are "NO", may proceed with cephalosporin use.   Elemental Sulfur Rash   Other Rash    Mycin Drugs/ unkown   Sulfa  Antibiotics Rash   Objective:  There were no vitals filed for this visit. There is no height or weight on file to calculate BMI. Constitutional Well developed. Well nourished.  Vascular Dorsalis pedis pulses palpable bilaterally. Posterior tibial pulses palpable bilaterally. Capillary refill normal to all digits.  No cyanosis or clubbing noted. Pedal hair growth normal.  Neurologic Normal speech. Oriented to person, place, and time. Epicritic sensation to light touch grossly present bilaterally.  Dermatologic Nails well groomed and normal in appearance. No open wounds. No skin lesions.  Orthopedic: Pain on palpation to the calcaneal tuber small superficial defect noted across the plantar fascial band.  No open wounds or lesion noted.  No ecchymosis noted mild swelling noted.   Radiographs: None Assessment:   1. Nontraumatic tear of plantar fascia    Plan:  Patient was evaluated and treated and all questions answered.  Right plantar fascial tear - All questions and concerns were discussed with the patient extensive detail she may have acutely ruptured the plantar fascia band.  She has a history of plantar fasciitis.  She is known to Dr. Clydia Dart.  I discussed with her she would benefit from continuous cam boot immobilization for another 4 weeks.  She states understanding will do so immediately. - She will see Dr. Clydia Dart back in 4 weeks  No follow-ups on file.

## 2023-09-25 ENCOUNTER — Ambulatory Visit: Admitting: Podiatry

## 2023-09-25 DIAGNOSIS — M629 Disorder of muscle, unspecified: Secondary | ICD-10-CM | POA: Diagnosis not present

## 2023-09-25 NOTE — Patient Instructions (Signed)

## 2023-09-26 NOTE — Progress Notes (Signed)
  Subjective:  Patient ID: Lindsay Peck, female    DOB: May 06, 1945,  MRN: [id number]  Chief Complaint  Patient presents with   Foot Pain    Patient is here for torn plantar fascia of right foot x 4 weeks ago. Dr. Lydia Sams advised patient to wear a boot until next appt.     78 y.o. female presents with the above complaint.  States that she is feeling better.  She is been wearing the cam boot consistently.  Her symptoms have improved since she was here last and not any significant pain.  No other concerns or new concerns today.   Review of Systems: Negative except as noted in the HPI. Denies N/V/F/Ch.  Objective:  General: AAO x3, NAD-present with son  Dermatological: Skin is warm, dry and supple bilateral. There are no open sores, no preulcerative lesions, no rash or signs of infection present.  No bruising present.  Vascular: Dorsalis Pedis artery and Posterior Tibial artery pedal pulses are 2/4 bilateral with immedate capillary fill time.  There is no pain with calf compression, swelling, warmth, erythema.   Neruologic: Grossly intact via light touch bilateral.   Musculoskeletal: She has a mild discomfort on the plantar aspect of calcaneus on insertion of plantar fascia as well as in the arch of the foot on the plantar fascia.  There is no area pinpoint tenderness identified  Gait: Unassisted, Nonantalgic.    Radiographs: None Assessment:   1. Nontraumatic tear of plantar fascia    Plan:  Patient was evaluated and treated and all questions answered.  Right plantar fascial tear -Ice, discussed gradual transition back to regular shoe as tolerated.  Discussed icing, stretching exercises.  Referral to physical therapy sent to [facility].  If her symptoms continue or worsen recommend to go back into the walking boot.  Return in about 6 weeks (around 11/06/2023) for plantar fascia tear, follow up from physical therapy .  Charity Conch DPM

## 2023-10-02 NOTE — Therapy (Signed)
 OUTPATIENT PHYSICAL THERAPY LOWER EXTREMITY EVALUATION   Patient Name: Lindsay Peck MRN: [id number] DOB:1945-07-07, 77 y.o., female Today's Date: 10/05/2023  END OF SESSION:  PT End of Session - 10/05/23 1150     Visit Number 1    Number of Visits 12    Date for PT Re-Evaluation 12/05/23    Authorization Type [employer] MCR    PT Start Time 1045    PT Stop Time 1130    PT Time Calculation (min) 45 min    Activity Tolerance Patient tolerated treatment well;Patient limited by pain    Behavior During Therapy WFL for tasks assessed/performed             Past Medical History:  Diagnosis Date   Arthritis    "In hand"   Cancer (HCC)    Precancerous on hand (Bilaterally)   Hypertension    Right arm fracture    Urinary retention    Past Surgical History:  Procedure Laterality Date   EYE SURGERY Bilateral    Cataract   PUBOVAGINAL SLING N/A 07/15/2019   Procedure: Gino Lais;  Surgeon: Andrez Banker, MD;  Location: [facility];  Service: Urology;  Laterality: N/A;   ROBOTIC ASSISTED LAPAROSCOPIC SACROCOLPOPEXY N/A 07/15/2019   Procedure: XI ROBOTIC ASSISTED LAPAROSCOPIC SACROCOLPOPEXY,SUPRACERVICAL HYSTERECTOMY;  Surgeon: Andrez Banker, MD;  Location: [facility];  Service: Urology;  Laterality: N/A;   TUBAL LIGATION     Patient Active Problem List   Diagnosis Date Noted   Dizziness 11/15/2021   Ear pressure, right 11/15/2021   Temporomandibular jaw dysfunction 11/15/2021   Cystocele with prolapse 07/15/2019   Status post arthroscopy of right knee 10/22/2016   Acute pain of right knee 08/25/2016   Complex tear of medial meniscus of right knee as current injury 08/25/2016    PCP: Perley Bradley, MD   REFERRING PROVIDER: Charity Conch, DPM  REFERRING DIAG: M62.9 (ICD-10-CM) - Nontraumatic tear of plantar fascia  THERAPY DIAG:  Plantar fasciitis of right foot  Rationale for Evaluation and Treatment: Rehabilitation  ONSET DATE: 08/26/23  SUBJECTIVE:    SUBJECTIVE STATEMENT:  Foot Pain      Patient is here for torn plantar fascia of right foot x 4 weeks ago. Dr. Lydia Sams advised patient to wear a boot until next appt.        78 y.o. female presents with the above complaint.  States that she is feeling better.  She is been wearing the cam boot consistently.  Her symptoms have improved since she was here last and not any significant pain.  No other concerns or new concerns today.  PERTINENT HISTORY: Right plantar fascial tear -Ice, discussed gradual transition back to regular shoe as tolerated.  Discussed icing, stretching exercises.  Referral to physical therapy sent to [facility].  If her symptoms continue or worsen recommend to go back into the walking boot.   Return in about 6 weeks (around 11/06/2023) for plantar fascia tear, follow up from physical therapy . PAIN:  Are you having pain? Yes: NPRS scale: 5/10 Pain location: R arch Pain description: ache Aggravating factors: weight bearing Relieving factors: rest  PRECAUTIONS: None  RED FLAGS: None   WEIGHT BEARING RESTRICTIONS: No  FALLS:  Has patient fallen in last 6 months? No  OCCUPATION: retired  PLOF: Independent  PATIENT GOALS: To manage my R foot pain  NEXT MD VISIT: 6 weeks  OBJECTIVE:  Note: Objective measures were completed at Evaluation unless otherwise noted.  DIAGNOSTIC FINDINGS: none  available  PATIENT SURVEYS:  LEFS 21/80   MUSCLE LENGTH: Not tested  POSTURE: No Significant postural limitations  PALPATION: TTP plantar fascia origin  LOWER EXTREMITY ROM:  Active ROM Right eval Left eval  Hip flexion    Hip extension    Hip abduction    Hip adduction    Hip internal rotation    Hip external rotation    Knee flexion    Knee extension    Ankle dorsiflexion 5d 5d  Ankle plantarflexion    Ankle inversion 20d 30d  Ankle eversion 15d 20d   (Blank rows = not tested)  LOWER EXTREMITY MMT:  MMT Right eval Left eval   Hip flexion    Hip extension    Hip abduction    Hip adduction    Hip internal rotation    Hip external rotation    Knee flexion    Knee extension    Ankle dorsiflexion 4-   Ankle plantarflexion 3   Ankle inversion 4-   Ankle eversion 4-    (Blank rows = not tested)  LOWER EXTREMITY SPECIAL TESTS:  deferred  FUNCTIONAL TESTS:  30 seconds chair stand test 7 reps   GAIT: Distance walked: 1ft x2 Assistive device utilized: None Level of assistance: Complete Independence Comments: antalgic with decreased step length L                                                                                                                                TREATMENT DATE:  [facility] Adult PT Treatment:                                                DATE: 10/05/23 Eval and HEP Self Care: Additional minutes spent for educating on updated Therapeutic Home Exercise Program as well as comparing current status to condition at start of symptoms. This included exercises focusing on stretching, strengthening, with focus on eccentric aspects. Long term goals include an improvement in range of motion, strength, endurance as well as avoiding reinjury. Patient's frequency would include in 1-2 times a day, 3-5 times a week for a duration of 6-12 weeks. Proper technique shown and discussed handout in great detail. All questions were discussed and addressed.      PATIENT EDUCATION:  Education details: Discussed eval findings, rehab rationale and POC and patient is in agreement  Person educated: Patient Education method: Explanation and Handouts Education comprehension: verbalized understanding and needs further education  HOME EXERCISE PROGRAM: Access Code: [email or web address] URL: [email or web address] Date: 10/05/2023 Prepared by: Gretta Leavens  Exercises - Long Sitting Plantar Fascia Stretch with Towel  - 2-3 x daily - 5 x weekly - 1 sets - 2 reps - 30s hold - Ankle Inversion Eversion Towel Slide  -  2-3 x daily - 5 x weekly - 2  sets - 15 reps - Seated Heel Toe Raises  - 2-3 x daily - 5 x weekly - 2 sets - 15 reps - Seated Heel Raise  - 2-3 x daily - 5 x weekly - 2 sets - 15 reps  ASSESSMENT:  CLINICAL IMPRESSION: Patient is a 78 y.o. female who was seen today for physical therapy evaluation and treatment for R plantar fascia tear.  Patient presents with mild ROM and strength deficits in R ankle as well as TTP at origin of plantar fascia.  Pain limits prolonged standing and WB.     OBJECTIVE IMPAIRMENTS: Abnormal gait, decreased activity tolerance, decreased balance, decreased knowledge of condition, decreased mobility, difficulty walking, decreased strength, increased fascial restrictions, improper body mechanics, and pain.   ACTIVITY LIMITATIONS: standing, squatting, and stairs  PERSONAL FACTORS: Age, Fitness, Past/current experiences, and Time since onset of injury/illness/exacerbation are also affecting patient's functional outcome.   REHAB POTENTIAL: Good  CLINICAL DECISION MAKING: Evolving/moderate complexity  EVALUATION COMPLEXITY: Low   GOALS: Goals reviewed with patient? No  SHORT TERM GOALS: Target date: 10/26/2023   Patient to demonstrate independence in HEP  Baseline: [email or web address] Goal status: INITIAL  2.  258ft ambulation w/o AD Baseline: 65ft w/o AD Goal status: INITIAL  LONG TERM GOALS: Target date: 11/16/2023    Patient will acknowledge 2/10 pain at least once during episode of care  Baseline: 5/10 Goal status: INITIAL  2.  Patient will score at least 40/80 on LEFS to signify clinically meaningful improvement in functional abilities.   Baseline: 21/80 Goal status: INITIAL  3.  Patient will increase 30s chair stand reps from 7 to 10 without arms to demonstrate and improved functional ability with less pain/difficulty as well as reduce fall risk. Baseline: 7 Goal status: INITIAL  4.  Increase R ankle strength to 4/5 Baseline:  MMT Right eval Left eval   Hip flexion    Hip extension    Hip abduction    Hip adduction    Hip internal rotation    Hip external rotation    Knee flexion    Knee extension    Ankle dorsiflexion 4-   Ankle plantarflexion 3   Ankle inversion 4-   Ankle eversion 4-    Goal status: INITIAL  5.  Increase AROM DF to 8d Baseline:  Active ROM Right eval Left eval  Hip flexion    Hip extension    Hip abduction    Hip adduction    Hip internal rotation    Hip external rotation    Knee flexion    Knee extension    Ankle dorsiflexion 5d 5d  Ankle plantarflexion    Ankle inversion 20d 30d  Ankle eversion 15d 20d   Goal status: INITIAL     PLAN:  PT FREQUENCY: 1-2x/week  PT DURATION: 6 weeks  PLANNED INTERVENTIONS: 97110-Therapeutic exercises, 97530- Therapeutic activity, 97112- Neuromuscular re-education, 97535- Self Care, [id number]- Manual therapy, [phone]- Gait training, Balance training, Stair training, and Joint mobilization  PLAN FOR NEXT SESSION: HEP review and update, manual techniques as appropriate, aerobic tasks, ROM and flexibility activities, strengthening and PREs, TPDN, gait and balance training as needed     Eldon Greenland, PT 10/05/2023, 11:55 AM

## 2023-10-05 ENCOUNTER — Ambulatory Visit: Attending: Podiatry

## 2023-10-05 ENCOUNTER — Other Ambulatory Visit: Payer: Self-pay

## 2023-10-05 DIAGNOSIS — M629 Disorder of muscle, unspecified: Secondary | ICD-10-CM | POA: Diagnosis not present

## 2023-10-05 DIAGNOSIS — L910 Hypertrophic scar: Secondary | ICD-10-CM | POA: Diagnosis not present

## 2023-10-05 DIAGNOSIS — M722 Plantar fascial fibromatosis: Secondary | ICD-10-CM | POA: Diagnosis not present

## 2023-10-09 ENCOUNTER — Ambulatory Visit

## 2023-10-09 DIAGNOSIS — M629 Disorder of muscle, unspecified: Secondary | ICD-10-CM | POA: Diagnosis not present

## 2023-10-09 DIAGNOSIS — M722 Plantar fascial fibromatosis: Secondary | ICD-10-CM | POA: Diagnosis not present

## 2023-10-09 NOTE — Therapy (Signed)
 OUTPATIENT PHYSICAL THERAPY TREATMENT NOTE   Patient Name: Lindsay Peck MRN: [id number] DOB:January 05, 1946, 78 y.o., female Today's Date: 10/09/2023  END OF SESSION:  PT End of Session - 10/09/23 1345     Visit Number 2    Number of Visits 12    Date for PT Re-Evaluation 12/05/23    Authorization Type [employer] MCR    PT Start Time 1340    PT Stop Time 1420    PT Time Calculation (min) 40 min    Activity Tolerance Patient tolerated treatment well;Patient limited by pain    Behavior During Therapy WFL for tasks assessed/performed          Past Medical History:  Diagnosis Date   Arthritis    In hand   Cancer (HCC)    Precancerous on hand (Bilaterally)   Hypertension    Right arm fracture    Urinary retention    Past Surgical History:  Procedure Laterality Date   EYE SURGERY Bilateral    Cataract   PUBOVAGINAL SLING N/A 07/15/2019   Procedure: Gino Lais;  Surgeon: Andrez Banker, MD;  Location: [facility];  Service: Urology;  Laterality: N/A;   ROBOTIC ASSISTED LAPAROSCOPIC SACROCOLPOPEXY N/A 07/15/2019   Procedure: XI ROBOTIC ASSISTED LAPAROSCOPIC SACROCOLPOPEXY,SUPRACERVICAL HYSTERECTOMY;  Surgeon: Andrez Banker, MD;  Location: [facility];  Service: Urology;  Laterality: N/A;   TUBAL LIGATION     Patient Active Problem List   Diagnosis Date Noted   Dizziness 11/15/2021   Ear pressure, right 11/15/2021   Temporomandibular jaw dysfunction 11/15/2021   Cystocele with prolapse 07/15/2019   Status post arthroscopy of right knee 10/22/2016   Acute pain of right knee 08/25/2016   Complex tear of medial meniscus of right knee as current injury 08/25/2016    PCP: Perley Bradley, MD   REFERRING PROVIDER: Charity Conch, DPM  REFERRING DIAG: M62.9 (ICD-10-CM) - Nontraumatic tear of plantar fascia  THERAPY DIAG:  Plantar fasciitis of right foot  Rationale for Evaluation and Treatment: Rehabilitation  ONSET DATE: 08/26/23  SUBJECTIVE:   SUBJECTIVE  STATEMENT: No new c/o, concerns or questions since IE     78 y.o. female presents with the above complaint.  States that she is feeling better.  She is been wearing the cam boot consistently.  Her symptoms have improved since she was here last and not any significant pain.  No other concerns or new concerns today.  PERTINENT HISTORY: Right plantar fascial tear -Ice, discussed gradual transition back to regular shoe as tolerated.  Discussed icing, stretching exercises.  Referral to physical therapy [facility].  If her symptoms continue or worsen recommend to go back into the walking boot.   Return in about 6 weeks (around 11/06/2023) for plantar fascia tear, follow up from physical therapy . PAIN:  Are you having pain? Yes: NPRS scale: 5/10 Pain location: R arch Pain description: ache Aggravating factors: weight bearing Relieving factors: rest  PRECAUTIONS: None  RED FLAGS: None   WEIGHT BEARING RESTRICTIONS: No  FALLS:  Has patient fallen in last 6 months? No  OCCUPATION: retired  PLOF: Independent  PATIENT GOALS: To manage my R foot pain  NEXT MD VISIT: 6 weeks  OBJECTIVE:  Note: Objective measures were completed at Evaluation unless otherwise noted.  DIAGNOSTIC FINDINGS: none available  PATIENT SURVEYS:  LEFS 21/80   MUSCLE LENGTH: Not tested  POSTURE: No Significant postural limitations  PALPATION: TTP plantar fascia origin  LOWER EXTREMITY ROM:  Active ROM  Right eval Left eval  Hip flexion    Hip extension    Hip abduction    Hip adduction    Hip internal rotation    Hip external rotation    Knee flexion    Knee extension    Ankle dorsiflexion 5d 5d  Ankle plantarflexion    Ankle inversion 20d 30d  Ankle eversion 15d 20d   (Blank rows = not tested)  LOWER EXTREMITY MMT:  MMT Right eval Left eval  Hip flexion    Hip extension    Hip abduction    Hip adduction    Hip internal rotation    Hip external rotation     Knee flexion    Knee extension    Ankle dorsiflexion 4-   Ankle plantarflexion 3   Ankle inversion 4-   Ankle eversion 4-    (Blank rows = not tested)  LOWER EXTREMITY SPECIAL TESTS:  deferred  FUNCTIONAL TESTS:  30 seconds chair stand test 7 reps   GAIT: Distance walked: 83ft x2 Assistive device utilized: None Level of assistance: Complete Independence Comments: antalgic with decreased step length L                                                                                                                                TREATMENT DATE:  [facility] Adult PT Treatment:                                                DATE: 10/09/23 Therapeutic Exercise: Nustep L2 8 min Manual Therapy: Talocrural and subtalar ankle mobs to promote mobility and ROM 5x10 Therapeutic Activity: PF Stretch 60s Seated INV/EV over towel 60s 4 way ankle YTB 15x2 Runners step 4 in 10/10  [facility] Adult PT Treatment:                                                DATE: 10/05/23 Eval and HEP Self Care: Additional minutes spent for educating on updated Therapeutic Home Exercise Program as well as comparing current status to condition at start of symptoms. This included exercises focusing on stretching, strengthening, with focus on eccentric aspects. Long term goals include an improvement in range of motion, strength, endurance as well as avoiding reinjury. Patient's frequency would include in 1-2 times a day, 3-5 times a week for a duration of 6-12 weeks. Proper technique shown and discussed handout in great detail. All questions were discussed and addressed.      PATIENT EDUCATION:  Education details: Discussed eval findings, rehab rationale and POC and patient is in agreement  Person educated: Patient Education method: Explanation and Handouts Education comprehension: verbalized understanding and needs further education  HOME EXERCISE PROGRAM: Access Code: [email or web address] URL:  [email or web address] Date: 10/05/2023 Prepared by: Gretta Leavens  Exercises - Long Sitting Plantar Fascia Stretch with Towel  - 2-3 x daily - 5 x weekly - 1 sets - 2 reps - 30s hold - Ankle Inversion Eversion Towel Slide  - 2-3 x daily - 5 x weekly - 2 sets - 15 reps - Seated Heel Toe Raises  - 2-3 x daily - 5 x weekly - 2 sets - 15 reps - Seated Heel Raise  - 2-3 x daily - 5 x weekly - 2 sets - 15 reps  ASSESSMENT:  CLINICAL IMPRESSION: Introduced aerobic w/u, stretching, resisted ankle strengthening, CKC tasks and balance/proprioceptive skills.  Able to complete all tasks w/o setback   Patient is a 78 y.o. female who was seen today for physical therapy evaluation and treatment for R plantar fascia tear.  Patient presents with mild ROM and strength deficits in R ankle as well as TTP at origin of plantar fascia.  Pain limits prolonged standing and WB.     OBJECTIVE IMPAIRMENTS: Abnormal gait, decreased activity tolerance, decreased balance, decreased knowledge of condition, decreased mobility, difficulty walking, decreased strength, increased fascial restrictions, improper body mechanics, and pain.   ACTIVITY LIMITATIONS: standing, squatting, and stairs  PERSONAL FACTORS: Age, Fitness, Past/current experiences, and Time since onset of injury/illness/exacerbation are also affecting patient's functional outcome.   REHAB POTENTIAL: Good  CLINICAL DECISION MAKING: Evolving/moderate complexity  EVALUATION COMPLEXITY: Low   GOALS: Goals reviewed with patient? No  SHORT TERM GOALS: Target date: 10/26/2023   Patient to demonstrate independence in HEP  Baseline: [email or web address] Goal status: INITIAL  2.  229ft ambulation w/o AD Baseline: 91ft w/o AD Goal status: INITIAL  LONG TERM GOALS: Target date: 11/16/2023    Patient will acknowledge 2/10 pain at least once during episode of care  Baseline: 5/10 Goal status: INITIAL  2.  Patient will score at least 40/80 on LEFS to  signify clinically meaningful improvement in functional abilities.   Baseline: 21/80 Goal status: INITIAL  3.  Patient will increase 30s chair stand reps from 7 to 10 without arms to demonstrate and improved functional ability with less pain/difficulty as well as reduce fall risk. Baseline: 7 Goal status: INITIAL  4.  Increase R ankle strength to 4/5 Baseline:  MMT Right eval Left eval  Hip flexion    Hip extension    Hip abduction    Hip adduction    Hip internal rotation    Hip external rotation    Knee flexion    Knee extension    Ankle dorsiflexion 4-   Ankle plantarflexion 3   Ankle inversion 4-   Ankle eversion 4-    Goal status: INITIAL  5.  Increase AROM DF to 8d Baseline:  Active ROM Right eval Left eval  Hip flexion    Hip extension    Hip abduction    Hip adduction    Hip internal rotation    Hip external rotation    Knee flexion    Knee extension    Ankle dorsiflexion 5d 5d  Ankle plantarflexion    Ankle inversion 20d 30d  Ankle eversion 15d 20d   Goal status: INITIAL     PLAN:  PT FREQUENCY: 1-2x/week  PT DURATION: 6 weeks  PLANNED INTERVENTIONS: 97110-Therapeutic exercises, 97530- Therapeutic activity, [id number]- Neuromuscular re-education, 97535- Self Care, 96045- Manual therapy, [phone]- Gait training, Balance training, Stair training, and Joint mobilization  PLAN FOR NEXT SESSION: HEP review  and update, manual techniques as appropriate, aerobic tasks, ROM and flexibility activities, strengthening and PREs, TPDN, gait and balance training as needed     Eldon Greenland, PT 10/09/2023, 2:26 PM

## 2023-10-12 ENCOUNTER — Ambulatory Visit: Admitting: Physical Therapy

## 2023-10-12 ENCOUNTER — Encounter: Payer: Self-pay | Admitting: Physical Therapy

## 2023-10-12 DIAGNOSIS — M629 Disorder of muscle, unspecified: Secondary | ICD-10-CM | POA: Diagnosis not present

## 2023-10-12 DIAGNOSIS — M722 Plantar fascial fibromatosis: Secondary | ICD-10-CM | POA: Diagnosis not present

## 2023-10-12 NOTE — Therapy (Signed)
 OUTPATIENT PHYSICAL THERAPY TREATMENT NOTE   Patient Name: Lindsay Peck MRN: [id number] DOB:Apr 24, 1946, 78 y.o., female Today's Date: 10/12/2023  END OF SESSION:  PT End of Session - 10/12/23 0958     Visit Number 3    Number of Visits 12    Date for PT Re-Evaluation 12/05/23    Authorization Type [employer] MCR    PT Start Time 1000    PT Stop Time 1040    PT Time Calculation (min) 40 min    Activity Tolerance Patient tolerated treatment well;Patient limited by pain    Behavior During Therapy WFL for tasks assessed/performed          Past Medical History:  Diagnosis Date   Arthritis    In hand   Cancer (HCC)    Precancerous on hand (Bilaterally)   Hypertension    Right arm fracture    Urinary retention    Past Surgical History:  Procedure Laterality Date   EYE SURGERY Bilateral    Cataract   PUBOVAGINAL SLING N/A 07/15/2019   Procedure: Gino Lais;  Surgeon: Andrez Banker, MD;  Location: [facility];  Service: Urology;  Laterality: N/A;   ROBOTIC ASSISTED LAPAROSCOPIC SACROCOLPOPEXY N/A 07/15/2019   Procedure: XI ROBOTIC ASSISTED LAPAROSCOPIC SACROCOLPOPEXY,SUPRACERVICAL HYSTERECTOMY;  Surgeon: Andrez Banker, MD;  Location: [facility];  Service: Urology;  Laterality: N/A;   TUBAL LIGATION     Patient Active Problem List   Diagnosis Date Noted   Dizziness 11/15/2021   Ear pressure, right 11/15/2021   Temporomandibular jaw dysfunction 11/15/2021   Cystocele with prolapse 07/15/2019   Status post arthroscopy of right knee 10/22/2016   Acute pain of right knee 08/25/2016   Complex tear of medial meniscus of right knee as current injury 08/25/2016    PCP: Perley Bradley, MD   REFERRING PROVIDER: Charity Conch, DPM  REFERRING DIAG: M62.9 (ICD-10-CM) - Nontraumatic tear of plantar fascia  THERAPY DIAG:  Plantar fasciitis of right foot  Rationale for Evaluation and Treatment: Rehabilitation  ONSET DATE: 08/26/23  SUBJECTIVE:   SUBJECTIVE  STATEMENT:  Pt attended today's session with reports of minimal pain. Pt stated that they have maintained great compliance with current HEP.  Wants to just decrease soreness and become more steady on her feet so she can get back to her group exercise classes.      78 y.o. female presents with the above complaint.  States that she is feeling better.  She is been wearing the cam boot consistently.  Her symptoms have improved since she was here last and not any significant pain.  No other concerns or new concerns today.  PERTINENT HISTORY: Right plantar fascial tear -Ice, discussed gradual transition back to regular shoe as tolerated.  Discussed icing, stretching exercises.  Referral to physical therapy [facility] to [facility].  If her symptoms continue or worsen recommend to go back into the walking boot.   Return in about 6 weeks (around 11/06/2023) for plantar fascia tear, follow up from physical therapy . PAIN:  Are you having pain? Yes: NPRS scale: 5/10 Pain location: R arch Pain description: ache Aggravating factors: weight bearing Relieving factors: rest  PRECAUTIONS: None  RED FLAGS: None   WEIGHT BEARING RESTRICTIONS: No  FALLS:  Has patient fallen in last 6 months? No  OCCUPATION: retired  PLOF: Independent  PATIENT GOALS: To manage my R foot pain  NEXT MD VISIT: 6 weeks  OBJECTIVE:  Note: Objective measures were completed at Evaluation unless otherwise  noted.  DIAGNOSTIC FINDINGS: none available  PATIENT SURVEYS:  LEFS 21/80   MUSCLE LENGTH: Not tested  POSTURE: No Significant postural limitations  PALPATION: TTP plantar fascia origin  LOWER EXTREMITY ROM:  Active ROM Right eval Left eval  Hip flexion    Hip extension    Hip abduction    Hip adduction    Hip internal rotation    Hip external rotation    Knee flexion    Knee extension    Ankle dorsiflexion 5d 5d  Ankle plantarflexion    Ankle inversion 20d 30d  Ankle eversion 15d  20d   (Blank rows = not tested)  LOWER EXTREMITY MMT:  MMT Right eval Left eval  Hip flexion    Hip extension    Hip abduction    Hip adduction    Hip internal rotation    Hip external rotation    Knee flexion    Knee extension    Ankle dorsiflexion 4-   Ankle plantarflexion 3   Ankle inversion 4-   Ankle eversion 4-    (Blank rows = not tested)  LOWER EXTREMITY SPECIAL TESTS:  deferred  FUNCTIONAL TESTS:  30 seconds chair stand test 7 reps   GAIT: Distance walked: 34ft x2 Assistive device utilized: None Level of assistance: Complete Independence Comments: antalgic with decreased step length L                                                                                                                                TREATMENT:  [facility] Adult PT Treatment:                                                DATE:10/12/2023 Therapeutic Activity: NuStep 8' for activity tolerance Slant board gastroc stretch 2x1' Slant board on 6 box soleus stretch 2x1' Split stance great toe isolation 2x15 Standing heel raise 2x12, hold 2s  [facility] Adult PT Treatment:                                                DATE: 10/09/23 Therapeutic Exercise: Nustep L2 8 min Manual Therapy: Talocrural and subtalar ankle mobs to promote mobility and ROM 5x10 Therapeutic Activity: PF Stretch 60s Seated INV/EV over towel 60s 4 way ankle YTB 15x2 Runners step 4 in 10/10  [facility] Adult PT Treatment:                                                DATE: 10/05/23 Eval and HEP Self Care: Additional minutes spent for  educating on updated Therapeutic Home Exercise Program as well as comparing current status to condition at start of symptoms. This included exercises focusing on stretching, strengthening, with focus on eccentric aspects. Long term goals include an improvement in range of motion, strength, endurance as well as avoiding reinjury. Patient's frequency would include in 1-2 times a day, 3-5 times a  week for a duration of 6-12 weeks. Proper technique shown and discussed handout in great detail. All questions were discussed and addressed.      PATIENT EDUCATION:  Education details: Discussed eval findings, rehab rationale and POC and patient is in agreement  Person educated: Patient Education method: Explanation and Handouts Education comprehension: verbalized understanding and needs further education  HOME EXERCISE PROGRAM: Access Code: [email or web address] URL: [email or web address] Date: 10/05/2023 Prepared by: Gretta Leavens  Exercises - Long Sitting Plantar Fascia Stretch with Towel  - 2-3 x daily - 5 x weekly - 1 sets - 2 reps - 30s hold - Ankle Inversion Eversion Towel Slide  - 2-3 x daily - 5 x weekly - 2 sets - 15 reps - Seated Heel Toe Raises  - 2-3 x daily - 5 x weekly - 2 sets - 15 reps - Seated Heel Raise  - 2-3 x daily - 5 x weekly - 2 sets - 15 reps  ASSESSMENT:  CLINICAL IMPRESSION: Pt attended physical therapy session for continuation of treatment regarding R plantar fascia tear. Today's treatment focused on improvement of  WB tolerance, plantar/posterior chain ankle mobility/motility, and general ankle stability/strengthening. Pt showed good tolerance to administered treatment with no adverse effects by the end of session. Skilled intervention was utilized via activity modification for pt tolerance with task completion, functional progression/regression promoting best outcomes inline with current rehab goals, as well as moderate verbal/tactile cuing alongside no physical assistance for safe and appropriate performance of today's activities. Continue to progress as tolerated.    Patient is a 78 y.o. female who was seen today for physical therapy evaluation and treatment for R plantar fascia tear.  Patient presents with mild ROM and strength deficits in R ankle as well as TTP at origin of plantar fascia.  Pain limits prolonged standing and WB.     OBJECTIVE  IMPAIRMENTS: Abnormal gait, decreased activity tolerance, decreased balance, decreased knowledge of condition, decreased mobility, difficulty walking, decreased strength, increased fascial restrictions, improper body mechanics, and pain.   ACTIVITY LIMITATIONS: standing, squatting, and stairs  PERSONAL FACTORS: Age, Fitness, Past/current experiences, and Time since onset of injury/illness/exacerbation are also affecting patient's functional outcome.   REHAB POTENTIAL: Good  CLINICAL DECISION MAKING: Evolving/moderate complexity  EVALUATION COMPLEXITY: Low   GOALS: Goals reviewed with patient? No  SHORT TERM GOALS: Target date: 10/26/2023   Patient to demonstrate independence in HEP  Baseline: [email or web address] Goal status: INITIAL  2.  241ft ambulation w/o AD Baseline: 60ft w/o AD Goal status: INITIAL  LONG TERM GOALS: Target date: 11/16/2023    Patient will acknowledge 2/10 pain at least once during episode of care  Baseline: 5/10 Goal status: INITIAL  2.  Patient will score at least 40/80 on LEFS to signify clinically meaningful improvement in functional abilities.   Baseline: 21/80 Goal status: INITIAL  3.  Patient will increase 30s chair stand reps from 7 to 10 without arms to demonstrate and improved functional ability with less pain/difficulty as well as reduce fall risk. Baseline: 7 Goal status: INITIAL  4.  Increase R ankle strength to 4/5 Baseline:  MMT Right eval Left eval  Hip  flexion    Hip extension    Hip abduction    Hip adduction    Hip internal rotation    Hip external rotation    Knee flexion    Knee extension    Ankle dorsiflexion 4-   Ankle plantarflexion 3   Ankle inversion 4-   Ankle eversion 4-    Goal status: INITIAL  5.  Increase AROM DF to 8d Baseline:  Active ROM Right eval Left eval  Hip flexion    Hip extension    Hip abduction    Hip adduction    Hip internal rotation    Hip external rotation    Knee flexion    Knee extension     Ankle dorsiflexion 5d 5d  Ankle plantarflexion    Ankle inversion 20d 30d  Ankle eversion 15d 20d   Goal status: INITIAL     PLAN:  PT FREQUENCY: 1-2x/week  PT DURATION: 6 weeks  PLANNED INTERVENTIONS: 97110-Therapeutic exercises, 97530- Therapeutic activity, 97112- Neuromuscular re-education, 97535- Self Care, 16109- Manual therapy, [phone]- Gait training, Balance training, Stair training, and Joint mobilization  PLAN FOR NEXT SESSION: HEP review and update, manual techniques as appropriate, aerobic tasks, ROM and flexibility activities, strengthening and PREs, TPDN, gait and balance training as needed   Albesa Huguenin, PT, DPT 10/12/2023, 10:43 AM

## 2023-10-13 DIAGNOSIS — Z1211 Encounter for screening for malignant neoplasm of colon: Secondary | ICD-10-CM | POA: Diagnosis not present

## 2023-10-18 NOTE — Therapy (Unsigned)
 OUTPATIENT PHYSICAL THERAPY TREATMENT NOTE   Patient Name: Lindsay Peck MRN: [id number] DOB:03-18-1946, 78 y.o., female Today's Date: 10/19/2023  END OF SESSION:  PT End of Session - 10/19/23 0920     Visit Number 4    Number of Visits 12    Date for PT Re-Evaluation 12/05/23    Authorization Type [employer] MCR    PT Start Time 0920    PT Stop Time 1000    PT Time Calculation (min) 40 min    Activity Tolerance Patient tolerated treatment well;Patient limited by pain    Behavior During Therapy [facility] for tasks assessed/performed           Past Medical History:  Diagnosis Date   Arthritis    In hand   Cancer (HCC)    Precancerous on hand (Bilaterally)   Hypertension    Right arm fracture    Urinary retention    Past Surgical History:  Procedure Laterality Date   EYE SURGERY Bilateral    Cataract   PUBOVAGINAL SLING N/A 07/15/2019   Procedure: CARLOYN GLADE;  Surgeon: Cam Morene ORN, MD;  Location: [facility];  Service: Urology;  Laterality: N/A;   ROBOTIC ASSISTED LAPAROSCOPIC SACROCOLPOPEXY N/A 07/15/2019   Procedure: XI ROBOTIC ASSISTED LAPAROSCOPIC SACROCOLPOPEXY,SUPRACERVICAL HYSTERECTOMY;  Surgeon: Cam Morene ORN, MD;  Location: [facility];  Service: Urology;  Laterality: N/A;   TUBAL LIGATION     Patient Active Problem List   Diagnosis Date Noted   Dizziness 11/15/2021   Ear pressure, right 11/15/2021   Temporomandibular jaw dysfunction 11/15/2021   Cystocele with prolapse 07/15/2019   Status post arthroscopy of right knee 10/22/2016   Acute pain of right knee 08/25/2016   Complex tear of medial meniscus of right knee as current injury 08/25/2016    PCP: Cleotilde Planas, MD   REFERRING PROVIDER: Gershon Donnice SAUNDERS, DPM  REFERRING DIAG: M62.9 (ICD-10-CM) - Nontraumatic tear of plantar fascia  THERAPY DIAG:  Plantar fasciitis of right foot  Rationale for Evaluation and Treatment: Rehabilitation  ONSET DATE: 08/26/23  SUBJECTIVE:   SUBJECTIVE  STATEMENT: Still reports 6/10 levels of discomfort, mainly at achilles tendon region.      78 y.o. female presents with the above complaint.  States that she is feeling better.  She is been wearing the cam boot consistently.  Her symptoms have improved since she was here last and not any significant pain.  No other concerns or new concerns today.  PERTINENT HISTORY: Right plantar fascial tear -Ice, discussed gradual transition back to regular shoe as tolerated.  Discussed icing, stretching exercises.  [facility]erral [facility].  If her symptoms continue or worsen recommend to go back into the walking boot.   Return in about 6 weeks (around 11/06/2023) for plantar fascia tear, follow up from physical therapy . PAIN:  Are you having pain? Yes: NPRS scale: 5/10 Pain location: R arch Pain description: ache Aggravating factors: weight bearing Relieving factors: rest  PRECAUTIONS: None  RED FLAGS: None   WEIGHT BEARING RESTRICTIONS: No  FALLS:  Has patient fallen in last 6 months? No  OCCUPATION: retired  PLOF: Independent  PATIENT GOALS: To manage my R foot pain  NEXT MD VISIT: 6 weeks  OBJECTIVE:  Note: Objective measures were completed at Evaluation unless otherwise noted.  DIAGNOSTIC FINDINGS: none available  PATIENT SURVEYS:  LEFS 21/80   MUSCLE LENGTH: Not tested  POSTURE: No Significant postural limitations  PALPATION: TTP plantar fascia origin  LOWER  EXTREMITY ROM:  Active ROM Right eval Left eval  Hip flexion    Hip extension    Hip abduction    Hip adduction    Hip internal rotation    Hip external rotation    Knee flexion    Knee extension    Ankle dorsiflexion 5d 5d  Ankle plantarflexion    Ankle inversion 20d 30d  Ankle eversion 15d 20d   (Blank rows = not tested)  LOWER EXTREMITY MMT:  MMT Right eval Left eval  Hip flexion    Hip extension    Hip abduction    Hip adduction    Hip internal  rotation    Hip external rotation    Knee flexion    Knee extension    Ankle dorsiflexion 4-   Ankle plantarflexion 3   Ankle inversion 4-   Ankle eversion 4-    (Blank rows = not tested)  LOWER EXTREMITY SPECIAL TESTS:  deferred  FUNCTIONAL TESTS:  30 seconds chair stand test 7 reps   GAIT: Distance walked: 77ft x2 Assistive device utilized: None Level of assistance: Complete Independence Comments: antalgic with decreased step length L                                                                                                                                TREATMENT: [facility] Adult PT Treatment:                                                DATE: 10/19/23 Therapeutic Exercise: Nustep L4 8 min Neuromuscular re-ed: Runners step from 4 in 10/10 Talocrural and subtalar ankle mobs to promote mobility and ROM 5x10 Therapeutic Activity: Gastroc/soleus stretch 30s x2 ea. Heel raises from 4 in step 10x2 4 way ankle YTB 15x  [facility] Adult PT Treatment:                                                DATE:10/12/2023 Therapeutic Activity: NuStep 8' for activity tolerance Slant board gastroc stretch 2x1' Slant board on 6 box soleus stretch 2x1' Split stance great toe isolation 2x15 Standing heel raise 2x12, hold 2s  [facility] Adult PT Treatment:                                                DATE: 10/09/23 Therapeutic Exercise: Nustep L2 8 min Manual Therapy: Talocrural and subtalar ankle mobs to promote mobility and ROM 5x10 Therapeutic Activity: PF Stretch 60s Seated INV/EV over towel 60s 4 way ankle YTB 15x2 Runners step 4  in 10/10  [facility] Adult PT Treatment:                                                DATE: 10/05/23 Eval and HEP Self Care: Additional minutes spent for educating on updated Therapeutic Home Exercise Program as well as comparing current status to condition at start of symptoms. This included exercises focusing on stretching, strengthening, with focus on  eccentric aspects. Long term goals include an improvement in range of motion, strength, endurance as well as avoiding reinjury. Patient's frequency would include in 1-2 times a day, 3-5 times a week for a duration of 6-12 weeks. Proper technique shown and discussed handout in great detail. All questions were discussed and addressed.      PATIENT EDUCATION:  Education details: Discussed eval findings, rehab rationale and POC and patient is in agreement  Person educated: Patient Education method: Explanation and Handouts Education comprehension: verbalized understanding and needs further education  HOME EXERCISE PROGRAM: Access Code: [email or web address] URL: [email or web address] Date: 10/05/2023 Prepared by: Reyes Kohut  Exercises - Long Sitting Plantar Fascia Stretch with Towel  - 2-3 x daily - 5 x weekly - 1 sets - 2 reps - 30s hold - Ankle Inversion Eversion Towel Slide  - 2-3 x daily - 5 x weekly - 2 sets - 15 reps - Seated Heel Toe Raises  - 2-3 x daily - 5 x weekly - 2 sets - 15 reps - Seated Heel Raise  - 2-3 x daily - 5 x weekly - 2 sets - 15 reps  ASSESSMENT:  CLINICAL IMPRESSION: Symptoms now reported in R achilles region, worsening with activity.  Added additional strengthening incorporating. eccentric tasks.  Able to complete all tasks w/o spike in pain levels.  Manual mobs uncomfortable.     Patient is a 78 y.o. female who was seen today for physical therapy evaluation and treatment for R plantar fascia tear.  Patient presents with mild ROM and strength deficits in R ankle as well as TTP at origin of plantar fascia.  Pain limits prolonged standing and WB.     OBJECTIVE IMPAIRMENTS: Abnormal gait, decreased activity tolerance, decreased balance, decreased knowledge of condition, decreased mobility, difficulty walking, decreased strength, increased fascial restrictions, improper body mechanics, and pain.   ACTIVITY LIMITATIONS: standing, squatting, and  stairs  PERSONAL FACTORS: Age, Fitness, Past/current experiences, and Time since onset of injury/illness/exacerbation are also affecting patient's functional outcome.   REHAB POTENTIAL: Good  CLINICAL DECISION MAKING: Evolving/moderate complexity  EVALUATION COMPLEXITY: Low   GOALS: Goals reviewed with patient? No  SHORT TERM GOALS: Target date: 10/26/2023   Patient to demonstrate independence in HEP  Baseline: [email or web address] Goal status: INITIAL  2.  227ft ambulation w/o AD Baseline: 31ft w/o AD Goal status: INITIAL  LONG TERM GOALS: Target date: 11/16/2023    Patient will acknowledge 2/10 pain at least once during episode of care  Baseline: 5/10 Goal status: INITIAL  2.  Patient will score at least 40/80 on LEFS to signify clinically meaningful improvement in functional abilities.   Baseline: 21/80 Goal status: INITIAL  3.  Patient will increase 30s chair stand reps from 7 to 10 without arms to demonstrate and improved functional ability with less pain/difficulty as well as reduce fall risk. Baseline: 7 Goal status: INITIAL  4.  Increase R ankle strength to 4/5 Baseline:  MMT Right  eval Left eval  Hip flexion    Hip extension    Hip abduction    Hip adduction    Hip internal rotation    Hip external rotation    Knee flexion    Knee extension    Ankle dorsiflexion 4-   Ankle plantarflexion 3   Ankle inversion 4-   Ankle eversion 4-    Goal status: INITIAL  5.  Increase AROM DF to 8d Baseline:  Active ROM Right eval Left eval  Hip flexion    Hip extension    Hip abduction    Hip adduction    Hip internal rotation    Hip external rotation    Knee flexion    Knee extension    Ankle dorsiflexion 5d 5d  Ankle plantarflexion    Ankle inversion 20d 30d  Ankle eversion 15d 20d   Goal status: INITIAL     PLAN:  PT FREQUENCY: 1-2x/week  PT DURATION: 6 weeks  PLANNED INTERVENTIONS: 97110-Therapeutic exercises, 97530- Therapeutic activity, 97112-  Neuromuscular re-education, 97535- Self Care, [id number]- Manual therapy, [phone]- Gait training, Balance training, Stair training, and Joint mobilization  PLAN FOR NEXT SESSION: HEP review and update, manual techniques as appropriate, aerobic tasks, ROM and flexibility activities, strengthening and PREs, TPDN, gait and balance training as needed   Jeff Loree Shehata PT  10/19/2023, 10:50 AM

## 2023-10-19 ENCOUNTER — Encounter: Payer: Self-pay | Admitting: Podiatry

## 2023-10-19 ENCOUNTER — Ambulatory Visit

## 2023-10-19 ENCOUNTER — Ambulatory Visit (INDEPENDENT_AMBULATORY_CARE_PROVIDER_SITE_OTHER): Admitting: Podiatry

## 2023-10-19 ENCOUNTER — Other Ambulatory Visit: Payer: Self-pay | Admitting: Podiatry

## 2023-10-19 DIAGNOSIS — M722 Plantar fascial fibromatosis: Secondary | ICD-10-CM

## 2023-10-19 DIAGNOSIS — M629 Disorder of muscle, unspecified: Secondary | ICD-10-CM

## 2023-10-19 MED ORDER — MELOXICAM 7.5 MG PO TABS: 7.5000 mg | ORAL_TABLET | Freq: Every day | ORAL | 0 refills | Status: AC | PRN

## 2023-10-19 NOTE — Therapy (Unsigned)
 OUTPATIENT PHYSICAL THERAPY TREATMENT NOTE   Patient Name: Lindsay Peck MRN: [id number] DOB:[birth year], 78 y.o., female Today's Date: 10/21/2023  END OF SESSION:  PT End of Session - 10/21/23 1134     Visit Number 5    Number of Visits 12    Date for PT Re-Evaluation 12/05/23    Authorization Type [employer] MCR    PT Start Time 1130    PT Stop Time 1200    PT Time Calculation (min) 30 min    Activity Tolerance Patient tolerated treatment well;Patient limited by pain    Behavior During Therapy WFL for tasks assessed/performed            Past Medical History:  Diagnosis Date   Arthritis    In hand   Cancer (HCC)    Precancerous on hand (Bilaterally)   Hypertension    Right arm fracture    Urinary retention    Past Surgical History:  Procedure Laterality Date   EYE SURGERY Bilateral    Cataract   PUBOVAGINAL SLING N/A 07/15/2019   Procedure: CARLOYN GLADE;  Surgeon: Cam Morene ORN, MD;  Location: [facility];  Service: Urology;  Laterality: N/A;   ROBOTIC ASSISTED LAPAROSCOPIC SACROCOLPOPEXY N/A 07/15/2019   Procedure: XI ROBOTIC ASSISTED LAPAROSCOPIC SACROCOLPOPEXY,SUPRACERVICAL HYSTERECTOMY;  Surgeon: Cam Morene ORN, MD;  Location: [facility];  Service: Urology;  Laterality: N/A;   TUBAL LIGATION     Patient Active Problem List   Diagnosis Date Noted   Dizziness 11/15/2021   Ear pressure, right 11/15/2021   Temporomandibular jaw dysfunction 11/15/2021   Cystocele with prolapse 07/15/2019   Status post arthroscopy of right knee 10/22/2016   Acute pain of right knee 08/25/2016   Complex tear of medial meniscus of right knee as current injury 08/25/2016    PCP: Cleotilde Planas, MD   REFERRING PROVIDER: Gershon Donnice SAUNDERS, DPM  REFERRING DIAG: M62.9 (ICD-10-CM) - Nontraumatic tear of plantar fascia  THERAPY DIAG:  Plantar fasciitis of right foot  Rationale for Evaluation and Treatment: Rehabilitation  ONSET DATE: 08/26/23  SUBJECTIVE:   SUBJECTIVE  STATEMENT: Symptom intensity unchanged.  MD f/u yesterday with MRI ordered.        78 y.o. female presents with the above complaint.  States that she is feeling better.  She is been wearing the cam boot consistently.  Her symptoms have improved since she was here last and not any significant pain.  No other concerns or new concerns today.  PERTINENT HISTORY: Right plantar fascial tear -Ice, discussed gradual transition back to regular shoe as tolerated.  Discussed icing, stretching exercises.  Referral to [facility] therapy [facility] to [facility].  If her symptoms continue or worsen recommend to go back into the walking boot.   Return in about 6 weeks (around 11/06/2023) for plantar fascia tear, follow up from physical therapy . PAIN:  Are you having pain? Yes: NPRS scale: 5/10 Pain location: R arch Pain description: ache Aggravating factors: weight bearing Relieving factors: rest  PRECAUTIONS: None  RED FLAGS: None   WEIGHT BEARING RESTRICTIONS: No  FALLS:  Has patient fallen in last 6 months? No  OCCUPATION: retired  PLOF: Independent  PATIENT GOALS: To manage my R foot pain  NEXT MD VISIT: 6 weeks  OBJECTIVE:  Note: Objective measures were completed at Evaluation unless otherwise noted.  DIAGNOSTIC FINDINGS: none available  PATIENT SURVEYS:  LEFS 21/80   MUSCLE LENGTH: Not tested  POSTURE: No Significant postural limitations  PALPATION: TTP plantar fascia origin  LOWER EXTREMITY ROM:  Active ROM Right eval Left eval  Hip flexion    Hip extension    Hip abduction    Hip adduction    Hip internal rotation    Hip external rotation    Knee flexion    Knee extension    Ankle dorsiflexion 5d 5d  Ankle plantarflexion    Ankle inversion 20d 30d  Ankle eversion 15d 20d   (Blank rows = not tested)  LOWER EXTREMITY MMT:  MMT Right eval Left eval  Hip flexion    Hip extension    Hip abduction    Hip adduction    Hip internal rotation     Hip external rotation    Knee flexion    Knee extension    Ankle dorsiflexion 4-   Ankle plantarflexion 3   Ankle inversion 4-   Ankle eversion 4-    (Blank rows = not tested)  LOWER EXTREMITY SPECIAL TESTS:  deferred  FUNCTIONAL TESTS:  30 seconds chair stand test 7 reps; 10/21/23 8 reps 10/21/23 SLS 13s L, 8s R  GAIT: Distance walked: 59ft x2 Assistive device utilized: None Level of assistance: Complete Independence Comments: antalgic with decreased step length L                                                                                                                                TREATMENT: [facility] Adult PT Treatment:                                                DATE: 10/21/23 Therapeutic Activity: Re-assessment of ROM, strength, balance and palpation with point tenderness elicited at origin of plantar fascia.  Unable to perform SL heel raise R due to pain/weakness.  Self Care: Discussion of POC regarding ongoing symptoms, lack of imaging to date and pending MRI.  Agree to place PT on hold until imaging obtained.  [facility] Adult PT Treatment:                                                DATE: 10/19/23 Therapeutic Exercise: Nustep L4 8 min Neuromuscular re-ed: Runners step from 4 in 10/10 Talocrural and subtalar ankle mobs to promote mobility and ROM 5x10 Therapeutic Activity: Gastroc/soleus stretch 30s x2 ea. Heel raises from 4 in step 10x2 4 way ankle YTB 15x  [facility] Adult PT Treatment:                                                DATE:10/12/2023 Therapeutic Activity: NuStep 8'  for activity tolerance Slant board gastroc stretch 2x1' Slant board on 6 box soleus stretch 2x1' Split stance great toe isolation 2x15 Standing heel raise 2x12, hold 2s  [facility] Adult PT Treatment:                                                DATE: 10/09/23 Therapeutic Exercise: Nustep L2 8 min Manual Therapy: Talocrural and subtalar ankle mobs to promote mobility and ROM  5x10 Therapeutic Activity: PF Stretch 60s Seated INV/EV over towel 60s 4 way ankle YTB 15x2 Runners step 4 in 10/10  [facility] Adult PT Treatment:                                                DATE: 10/05/23 Eval and HEP Self Care: Additional minutes spent for educating on updated Therapeutic Home Exercise Program as well as comparing current status to condition at start of symptoms. This included exercises focusing on stretching, strengthening, with focus on eccentric aspects. Long term goals include an improvement in range of motion, strength, endurance as well as avoiding reinjury. Patient's frequency would include in 1-2 times a day, 3-5 times a week for a duration of 6-12 weeks. Proper technique shown and discussed handout in great detail. All questions were discussed and addressed.      PATIENT EDUCATION:  Education details: Discussed eval findings, rehab rationale and POC and patient is in agreement  Person educated: Patient Education method: Explanation and Handouts Education comprehension: verbalized understanding and needs further education  HOME EXERCISE PROGRAM: Access Code: [email or web address] URL: [email or web address] Date: 10/21/2023 Prepared by: Reyes Kohut  Exercises - Long Sitting Plantar Fascia Stretch with Towel  - 2-3 x daily - 5 x weekly - 1 sets - 2 reps - 30s hold - Ankle Inversion Eversion Towel Slide  - 2-3 x daily - 5 x weekly - 2 sets - 15 reps - Seated Heel Toe Raises  - 2-3 x daily - 5 x weekly - 2 sets - 15 reps - Seated Heel Raise  - 2-3 x daily - 5 x weekly - 2 sets - 15 reps - Towel Scrunches  - 2-3 x daily - 5 x weekly - 2 sets - 10 reps  ASSESSMENT:  CLINICAL IMPRESSION: Continued symptoms in R arch and heel region, worse with resisted strengthening tasks.  Patient will undergo MRI on 7/10 and await results before further PT sessions    Patient is a 78 y.o. female who was seen today for physical therapy evaluation and treatment for R plantar  fascia tear.  Patient presents with mild ROM and strength deficits in R ankle as well as TTP at origin of plantar fascia.  Pain limits prolonged standing and WB.     OBJECTIVE IMPAIRMENTS: Abnormal gait, decreased activity tolerance, decreased balance, decreased knowledge of condition, decreased mobility, difficulty walking, decreased strength, increased fascial restrictions, improper body mechanics, and pain.   ACTIVITY LIMITATIONS: standing, squatting, and stairs  PERSONAL FACTORS: Age, Fitness, Past/current experiences, and Time since onset of injury/illness/exacerbation are also affecting patient's functional outcome.   REHAB POTENTIAL: Good  CLINICAL DECISION MAKING: Evolving/moderate complexity  EVALUATION COMPLEXITY: Low   GOALS: Goals reviewed with patient? No  SHORT TERM GOALS: Target date: 10/26/2023  Patient to demonstrate independence in HEP  Baseline: [email or web address] Goal status: INITIAL  2.  235ft ambulation w/o AD Baseline: 35ft w/o AD Goal status: INITIAL  LONG TERM GOALS: Target date: 11/16/2023    Patient will acknowledge 2/10 pain at least once during episode of care  Baseline: 5/10 Goal status: INITIAL  2.  Patient will score at least 40/80 on LEFS to signify clinically meaningful improvement in functional abilities.   Baseline: 21/80 Goal status: INITIAL  3.  Patient will increase 30s chair stand reps from 7 to 10 without arms to demonstrate and improved functional ability with less pain/difficulty as well as reduce fall risk. Baseline: 7; 10/21/23 8 Goal status: INITIAL  4.  Increase R ankle strength to 4/5 Baseline:  MMT Right eval Left eval  Hip flexion    Hip extension    Hip abduction    Hip adduction    Hip internal rotation    Hip external rotation    Knee flexion    Knee extension    Ankle dorsiflexion 4-   Ankle plantarflexion 3   Ankle inversion 4-   Ankle eversion 4-    Goal status: INITIAL  5.  Increase AROM DF to 8d Baseline:   Active ROM Right eval Left eval  Hip flexion    Hip extension    Hip abduction    Hip adduction    Hip internal rotation    Hip external rotation    Knee flexion    Knee extension    Ankle dorsiflexion 5d 5d  Ankle plantarflexion    Ankle inversion 20d 30d  Ankle eversion 15d 20d   Goal status: INITIAL     PLAN:  PT FREQUENCY: 1-2x/week  PT DURATION: 6 weeks  PLANNED INTERVENTIONS: 97110-Therapeutic exercises, 97530- Therapeutic activity, 97112- Neuromuscular re-education, 97535- Self Care, 02859- Manual therapy, [phone]- Gait training, Balance training, Stair training, and Joint mobilization  PLAN FOR NEXT SESSION: HEP review and update, manual techniques as appropriate, aerobic tasks, ROM and flexibility activities, strengthening and PREs, TPDN, gait and balance training as needed   Jeff Leanard Dimaio PT  10/21/2023, 1:20 PM

## 2023-10-21 ENCOUNTER — Ambulatory Visit

## 2023-10-21 DIAGNOSIS — M722 Plantar fascial fibromatosis: Secondary | ICD-10-CM | POA: Diagnosis not present

## 2023-10-21 DIAGNOSIS — M629 Disorder of muscle, unspecified: Secondary | ICD-10-CM | POA: Diagnosis not present

## 2023-10-21 NOTE — Progress Notes (Signed)
  Subjective:  Patient ID: Lindsay Peck, female    DOB: 09/26/1945,  MRN: [id number]  Chief Complaint  Patient presents with   Plantar Fascia Tear    Minor improvement.       78 y.o. female presents with the above complaint.  States that she is feeling little bit better but overall she has not seen significant improvement but she was hoping she would.  She is doing physical therapy was helping a little bit.  She is back on her regular shoe.      Objective:  General: AAO x3, NAD-present with son  Dermatological: Skin is warm, dry and supple bilateral. There are no open sores, no preulcerative lesions, no rash or signs of infection present.  No bruising present.  Vascular: Dorsalis Pedis artery and Posterior Tibial artery pedal pulses are 2/4 bilateral with immedate capillary fill time.  There is no pain with calf compression, swelling, warmth, erythema.   Neruologic: Grossly intact via light touch bilateral.   Musculoskeletal: She has continued discomfort on the plantar aspect of calcaneus on insertion of plantar fascia as well as in the arch of the foot on the plantar fascia.  There is no area pinpoint tenderness identified  Gait: Unassisted, Nonantalgic.    Radiographs: None Assessment:   1. Nontraumatic tear of plantar fascia    Plan:  Patient was evaluated and treated and all questions answered.  Right plantar fascial tear -At this time given ongoing nature of her symptoms we mutually agreed to proceed with an MRI which is ordered.  Still continue physical therapy, icing daily as well as supportive shoe gear not going barefoot.  We discussed possible going back into the boot that was causing other issues so he can hold off on that for now but if she feels more comfortable the boot she can continue with that.   Donnice JONELLE Fees DPM

## 2023-10-27 ENCOUNTER — Encounter: Admitting: Physical Therapy

## 2023-10-29 ENCOUNTER — Encounter: Admitting: Physical Therapy

## 2023-11-02 ENCOUNTER — Encounter

## 2023-11-04 ENCOUNTER — Ambulatory Visit

## 2023-11-05 ENCOUNTER — Ambulatory Visit
Admission: RE | Admit: 2023-11-05 | Discharge: 2023-11-05 | Disposition: A | Discharge status: Home / Self Care | Source: Ambulatory Visit | Attending: Podiatry | Admitting: Podiatry

## 2023-11-05 DIAGNOSIS — R600 Localized edema: Secondary | ICD-10-CM | POA: Diagnosis not present

## 2023-11-05 DIAGNOSIS — M25571 Pain in right ankle and joints of right foot: Secondary | ICD-10-CM | POA: Diagnosis not present

## 2023-11-05 DIAGNOSIS — M629 Disorder of muscle, unspecified: Secondary | ICD-10-CM

## 2023-11-10 ENCOUNTER — Ambulatory Visit

## 2023-11-10 ENCOUNTER — Ambulatory Visit: Payer: Self-pay | Admitting: Podiatry

## 2023-11-11 ENCOUNTER — Telehealth: Payer: Self-pay | Admitting: Podiatry

## 2023-11-11 NOTE — Therapy (Unsigned)
 OUTPATIENT PHYSICAL THERAPY TREATMENT NOTE   Patient Name: Lindsay Peck MRN: [id number] DOB:March 08, 1946, 78 y.o., female Today's Date: 11/12/2023  END OF SESSION:  PT End of Session - 11/12/23 1527     Visit Number 6    Number of Visits 12    Date for PT Re-Evaluation 12/05/23    Authorization Type [employer] MCR    PT Start Time 1530    PT Stop Time 1608    PT Time Calculation (min) 38 min    Activity Tolerance Patient tolerated treatment well;Patient limited by pain    Behavior During Therapy WFL for tasks assessed/performed             Past Medical History:  Diagnosis Date   Arthritis    In hand   Cancer (HCC)    Precancerous on hand (Bilaterally)   Hypertension    Right arm fracture    Urinary retention    Past Surgical History:  Procedure Laterality Date   EYE SURGERY Bilateral    Cataract   PUBOVAGINAL SLING N/A 07/15/2019   Procedure: CARLOYN GLADE;  Surgeon: Cam Morene ORN, MD;  Location: [facility];  Service: Urology;  Laterality: N/A;   ROBOTIC ASSISTED LAPAROSCOPIC SACROCOLPOPEXY N/A 07/15/2019   Procedure: XI ROBOTIC ASSISTED LAPAROSCOPIC SACROCOLPOPEXY,SUPRACERVICAL HYSTERECTOMY;  Surgeon: Cam Morene ORN, MD;  Location: [facility];  Service: Urology;  Laterality: N/A;   TUBAL LIGATION     Patient Active Problem List   Diagnosis Date Noted   Dizziness 11/15/2021   Ear pressure, right 11/15/2021   Temporomandibular jaw dysfunction 11/15/2021   Cystocele with prolapse 07/15/2019   Status post arthroscopy of right knee 10/22/2016   Acute pain of right knee 08/25/2016   Complex tear of medial meniscus of right knee as current injury 08/25/2016    PCP: Cleotilde Planas, MD   REFERRING PROVIDER: Gershon Donnice SAUNDERS, DPM  REFERRING DIAG: M62.9 (ICD-10-CM) - Nontraumatic tear of plantar fascia  THERAPY DIAG:  Plantar fasciitis of right foot  Unsteadiness on feet  Muscle weakness (generalized)  Rationale for Evaluation and Treatment:  Rehabilitation  ONSET DATE: 08/26/23  SUBJECTIVE:   SUBJECTIVE STATEMENT: Returns to PT following MRI showing cuboid inflammation.      78 y.o. female presents with the above complaint.  States that she is feeling better.  She is been wearing the cam boot consistently.  Her symptoms have improved since she was here last and not any significant pain.  No other concerns or new concerns today.  PERTINENT HISTORY: Right plantar fascial tear -Ice, discussed gradual transition back to regular shoe as tolerated.  Discussed icing, stretching exercises.  [facility]erral to [facility] therapy [facility].  If her symptoms continue or worsen recommend to go back into the walking boot.   Return in about 6 weeks (around 11/06/2023) for plantar fascia tear, follow up from physical therapy . PAIN:  Are you having pain? Yes: NPRS scale: 5/10 Pain location: R arch Pain description: ache Aggravating factors: weight bearing Relieving factors: rest  PRECAUTIONS: None  RED FLAGS: None   WEIGHT BEARING RESTRICTIONS: No  FALLS:  Has patient fallen in last 6 months? No  OCCUPATION: retired  PLOF: Independent  PATIENT GOALS: To manage my R foot pain  NEXT MD VISIT: 6 weeks  OBJECTIVE:  Note: Objective measures were completed at Evaluation unless otherwise noted.  DIAGNOSTIC FINDINGS: none available 11/12/23 IMPRESSION: Mild edema in the cuboid which may be related to degenerative edema or a stress reaction. There  is no displaced fracture identified. Correlation for lateral midfoot/hindfoot pain.   Mild edema in the plantar aspect of the calcaneus with thickening of the medial lateral fascia consistent with plantar fasciitis.   Electronically signed by: Norleen Satchel MD 11/06/2023 11:36 AM EDT RP Workstation: [id number]  PATIENT SURVEYS:  LEFS 21/80   MUSCLE LENGTH: Not tested  POSTURE: No Significant postural limitations  PALPATION: TTP plantar fascia  origin  LOWER EXTREMITY ROM:  Active ROM Right eval Left eval R  11/12/23  Hip flexion     Hip extension     Hip abduction     Hip adduction     Hip internal rotation     Hip external rotation     Knee flexion     Knee extension     Ankle dorsiflexion 5d 5d 8/12d  Ankle plantarflexion     Ankle inversion 20d 30d   Ankle eversion 15d 20d    (Blank rows = not tested)  LOWER EXTREMITY MMT:  MMT Right eval Left eval  Hip flexion    Hip extension    Hip abduction    Hip adduction    Hip internal rotation    Hip external rotation    Knee flexion    Knee extension    Ankle dorsiflexion 4-   Ankle plantarflexion 3   Ankle inversion 4-   Ankle eversion 4-    (Blank rows = not tested)  LOWER EXTREMITY SPECIAL TESTS:  deferred  FUNCTIONAL TESTS:  30 seconds chair stand test 7 reps; 10/21/23 8 reps 10/21/23 SLS 13s L, 8s R 11/12/23 SLS 15s L, 13s R  GAIT: Distance walked: 29ft x2 Assistive device utilized: None Level of assistance: Complete Independence Comments: antalgic with decreased step length L                                                                                                                                TREATMENT: [facility] Adult PT Treatment:                                                DATE: 11/12/23 Therapeutic Exercise: RB A/P static 60s RB A/P dynamic 60s Step ups onto RB with R lifting/lowering Neuromuscular re-ed: 10x STS from airex pad Heel raises against wall 15x Toe raises against wall 15x Therapeutic Activity: Re-assessment of ROM, mobility and function  [facility] Adult PT Treatment:                                                DATE: 10/21/23 Therapeutic Activity: Re-assessment of ROM, strength, balance and palpation with point tenderness elicited at origin of plantar fascia.  Unable to perform  SL heel raise R due to pain/weakness.  Self Care: Discussion of POC regarding ongoing symptoms, lack of imaging to date and pending MRI.   Agree to place PT on hold until imaging obtained.  [facility] Adult PT Treatment:                                                DATE: 10/19/23 Therapeutic Exercise: Nustep L4 8 min Neuromuscular re-ed: Runners step from 4 in 10/10 Talocrural and subtalar ankle mobs to promote mobility and ROM 5x10 Therapeutic Activity: Gastroc/soleus stretch 30s x2 ea. Heel raises from 4 in step 10x2 4 way ankle YTB 15x  [facility] Adult PT Treatment:                                                DATE:10/12/2023 Therapeutic Activity: NuStep 8' for activity tolerance Slant board gastroc stretch 2x1' Slant board on 6 box soleus stretch 2x1' Split stance great toe isolation 2x15 Standing heel raise 2x12, hold 2s  [facility] Adult PT Treatment:                                                DATE: 10/09/23 Therapeutic Exercise: Nustep L2 8 min Manual Therapy: Talocrural and subtalar ankle mobs to promote mobility and ROM 5x10 Therapeutic Activity: PF Stretch 60s Seated INV/EV over towel 60s 4 way ankle YTB 15x2 Runners step 4 in 10/10  [facility] Adult PT Treatment:                                                DATE: 10/05/23 Eval and HEP Self Care: Additional minutes spent for educating on updated Therapeutic Home Exercise Program as well as comparing current status to condition at start of symptoms. This included exercises focusing on stretching, strengthening, with focus on eccentric aspects. Long term goals include an improvement in range of motion, strength, endurance as well as avoiding reinjury. Patient's frequency would include in 1-2 times a day, 3-5 times a week for a duration of 6-12 weeks. Proper technique shown and discussed handout in great detail. All questions were discussed and addressed.      PATIENT EDUCATION:  Education details: Discussed eval findings, rehab rationale and POC and patient is in agreement  Person educated: Patient Education method: Explanation and Handouts Education comprehension:  verbalized understanding and needs further education  HOME EXERCISE PROGRAM: Access Code: [email or web address] URL: [email or web address] Date: 11/12/2023 Prepared by: Reyes Kohut  Exercises - Long Sitting Plantar Fascia Stretch with Towel  - 2-3 x daily - 5 x weekly - 1 sets - 2 reps - 30s hold - Ankle Inversion Eversion Towel Slide  - 2-3 x daily - 5 x weekly - 2 sets - 15 reps - Single Leg Stance with Support  - 2-3 x daily - 5 x weekly - 2 sets - 2 reps - 15s hold - Standing Heel Raise with Support  - 2-3 x daily - 5 x weekly -  1 sets - 10 reps - Toe Raise With Back Against Wall  - 2-3 x daily - 5 x weekly - 1 sets - 10 reps  ASSESSMENT:  CLINICAL IMPRESSION: MRI findings of cuboid bone inflammation(stress fx?).  Re-assessment of objective measures find improved mobility and less pain.  Progressed to more aggressive strength and stretch program, updating HEP as well.    Patient is a 78 y.o. female who was seen today for physical therapy evaluation and treatment for R plantar fascia tear.  Patient presents with mild ROM and strength deficits in R ankle as well as TTP at origin of plantar fascia.  Pain limits prolonged standing and WB.     OBJECTIVE IMPAIRMENTS: Abnormal gait, decreased activity tolerance, decreased balance, decreased knowledge of condition, decreased mobility, difficulty walking, decreased strength, increased fascial restrictions, improper body mechanics, and pain.   ACTIVITY LIMITATIONS: standing, squatting, and stairs  PERSONAL FACTORS: Age, Fitness, Past/current experiences, and Time since onset of injury/illness/exacerbation are also affecting patient's functional outcome.   REHAB POTENTIAL: Good  CLINICAL DECISION MAKING: Evolving/moderate complexity  EVALUATION COMPLEXITY: Low   GOALS: Goals reviewed with patient? No  SHORT TERM GOALS: Target date: 10/26/2023   Patient to demonstrate independence in HEP  Baseline: [email or web address] Goal status:  INITIAL  2.  238ft ambulation w/o AD Baseline: 59ft w/o AD Goal status: INITIAL  LONG TERM GOALS: Target date: 11/16/2023    Patient will acknowledge 2/10 pain at least once during episode of care  Baseline: 5/10 Goal status: INITIAL  2.  Patient will score at least 40/80 on LEFS to signify clinically meaningful improvement in functional abilities.   Baseline: 21/80 Goal status: INITIAL  3.  Patient will increase 30s chair stand reps from 7 to 10 without arms to demonstrate and improved functional ability with less pain/difficulty as well as reduce fall risk. Baseline: 7; 10/21/23 8 Goal status: INITIAL  4.  Increase R ankle strength to 4/5 Baseline:  MMT Right eval Left eval  Hip flexion    Hip extension    Hip abduction    Hip adduction    Hip internal rotation    Hip external rotation    Knee flexion    Knee extension    Ankle dorsiflexion 4-   Ankle plantarflexion 3   Ankle inversion 4-   Ankle eversion 4-    Goal status: INITIAL  5.  Increase AROM DF to 8d Baseline:  Active ROM Right eval Left eval  Hip flexion    Hip extension    Hip abduction    Hip adduction    Hip internal rotation    Hip external rotation    Knee flexion    Knee extension    Ankle dorsiflexion 5d 5d  Ankle plantarflexion    Ankle inversion 20d 30d  Ankle eversion 15d 20d   Goal status: INITIAL     PLAN:  PT FREQUENCY: 1-2x/week  PT DURATION: 6 weeks  PLANNED INTERVENTIONS: 97110-Therapeutic exercises, 97530- Therapeutic activity, 97112- Neuromuscular re-education, 97535- Self Care, [id number]- Manual therapy, [phone]- Gait training, Balance training, Stair training, and Joint mobilization  PLAN FOR NEXT SESSION: HEP review and update, manual techniques as appropriate, aerobic tasks, ROM and flexibility activities, strengthening and PREs, TPDN, gait and balance training as needed   Chyrl Kohut PT  11/12/2023, 4:58 PM

## 2023-11-11 NOTE — Telephone Encounter (Signed)
 The patient is requesting that an anti-inflammatory medication be sent to the pharmacy on file.  She would also like clarification on which shockwave therapy you are recommending -- EPAT or [facility].  The patient is aware that neither is covered by insurance, and she has been informed of the out-of-pocket cost for both options.

## 2023-11-12 ENCOUNTER — Ambulatory Visit: Attending: Podiatry

## 2023-11-12 ENCOUNTER — Ambulatory Visit

## 2023-11-12 DIAGNOSIS — M6281 Muscle weakness (generalized): Secondary | ICD-10-CM | POA: Insufficient documentation

## 2023-11-12 DIAGNOSIS — M722 Plantar fascial fibromatosis: Secondary | ICD-10-CM | POA: Insufficient documentation

## 2023-11-12 DIAGNOSIS — R2681 Unsteadiness on feet: Secondary | ICD-10-CM | POA: Insufficient documentation

## 2023-11-12 NOTE — Telephone Encounter (Signed)
 Spoke to patient will  call and schedule appointment for treatment [facility] patient is out at another doctors appointment at the moment.

## 2023-11-16 NOTE — Therapy (Signed)
 OUTPATIENT PHYSICAL THERAPY TREATMENT NOTE   Patient Name: Lindsay Peck MRN: [id number] DOB:1946/03/19, 78 y.o., female Today's Date: 11/17/2023  END OF SESSION:  PT End of Session - 11/17/23 1133     Visit Number 7    Number of Visits 12    Date for PT Re-Evaluation 12/05/23    Authorization Type [employer] MCR    PT Start Time 1130    PT Stop Time 1210    PT Time Calculation (min) 40 min    Activity Tolerance Patient tolerated treatment well;Patient limited by pain    Behavior During Therapy WFL for tasks assessed/performed              Past Medical History:  Diagnosis Date   Arthritis    In hand   Cancer (HCC)    Precancerous on hand (Bilaterally)   Hypertension    Right arm fracture    Urinary retention    Past Surgical History:  Procedure Laterality Date   EYE SURGERY Bilateral    Cataract   PUBOVAGINAL SLING N/A 07/15/2019   Procedure: CARLOYN GLADE;  Surgeon: Cam Morene ORN, MD;  Location: [facility];  Service: Urology;  Laterality: N/A;   ROBOTIC ASSISTED LAPAROSCOPIC SACROCOLPOPEXY N/A 07/15/2019   Procedure: XI ROBOTIC ASSISTED LAPAROSCOPIC SACROCOLPOPEXY,SUPRACERVICAL HYSTERECTOMY;  Surgeon: Cam Morene ORN, MD;  Location: [facility];  Service: Urology;  Laterality: N/A;   TUBAL LIGATION     Patient Active Problem List   Diagnosis Date Noted   Dizziness 11/15/2021   Ear pressure, right 11/15/2021   Temporomandibular jaw dysfunction 11/15/2021   Cystocele with prolapse 07/15/2019   Status post arthroscopy of right knee 10/22/2016   Acute pain of right knee 08/25/2016   Complex tear of medial meniscus of right knee as current injury 08/25/2016    PCP: Cleotilde Planas, MD   REFERRING PROVIDER: Gershon Donnice SAUNDERS, DPM  REFERRING DIAG: M62.9 (ICD-10-CM) - Nontraumatic tear of plantar fascia  THERAPY DIAG:  Plantar fasciitis of right foot  Unsteadiness on feet  Muscle weakness (generalized)  Rationale for Evaluation and Treatment:  Rehabilitation  ONSET DATE: 08/26/23  SUBJECTIVE:   SUBJECTIVE STATEMENT: No change to report.  Pain levels hover b/t 3-5/10. Will begin shock wave treatment on 8/5.  PERTINENT HISTORY: Right plantar fascial tear -Ice, discussed gradual transition back to regular shoe as tolerated.  Discussed icing, stretching exercises.  Referral to physical therapy [facility].  If her symptoms continue or worsen recommend to go back into the walking boot.   Return in about 6 weeks (around 11/06/2023) for plantar fascia tear, follow up from physical therapy . PAIN:  Are you having pain? Yes: NPRS scale: 5/10 Pain location: R arch Pain description: ache Aggravating factors: weight bearing Relieving factors: rest  PRECAUTIONS: None  RED FLAGS: None   WEIGHT BEARING RESTRICTIONS: No  FALLS:  Has patient fallen in last 6 months? No  OCCUPATION: retired  PLOF: Independent  PATIENT GOALS: To manage my R foot pain  NEXT MD VISIT: 6 weeks  OBJECTIVE:  Note: Objective measures were completed at Evaluation unless otherwise noted.  DIAGNOSTIC FINDINGS: none available 11/12/23 IMPRESSION: Mild edema in the cuboid which may be related to degenerative edema or a stress reaction. There is no displaced fracture identified. Correlation for lateral midfoot/hindfoot pain.   Mild edema in the plantar aspect of the calcaneus with thickening of the medial lateral fascia consistent with plantar fasciitis.   Electronically signed by: Norleen Satchel MD 11/06/2023 11:36  AM EDT RP Workstation: [id number]  PATIENT SURVEYS:  LEFS 21/80   MUSCLE LENGTH: Not tested  POSTURE: No Significant postural limitations  PALPATION: TTP plantar fascia origin  LOWER EXTREMITY ROM:  Active ROM Right eval Left eval R  11/12/23  Hip flexion     Hip extension     Hip abduction     Hip adduction     Hip internal rotation     Hip external rotation     Knee flexion     Knee extension      Ankle dorsiflexion 5d 5d 8/12d  Ankle plantarflexion     Ankle inversion 20d 30d   Ankle eversion 15d 20d    (Blank rows = not tested)  LOWER EXTREMITY MMT:  MMT Right eval Left eval  Hip flexion    Hip extension    Hip abduction    Hip adduction    Hip internal rotation    Hip external rotation    Knee flexion    Knee extension    Ankle dorsiflexion 4-   Ankle plantarflexion 3   Ankle inversion 4-   Ankle eversion 4-    (Blank rows = not tested)  LOWER EXTREMITY SPECIAL TESTS:  deferred  FUNCTIONAL TESTS:  30 seconds chair stand test 7 reps; 10/21/23 8 reps 10/21/23 SLS 13s L, 8s R 11/12/23 SLS 15s L, 13s R  GAIT: Distance walked: 67ft x2 Assistive device utilized: None Level of assistance: Complete Independence Comments: antalgic with decreased step length L                                                                                                                                TREATMENT: [facility] Adult PT Treatment:                                                DATE: 11/17/23 Therapeutic Exercise: Nustep L5 8 min 40 SPM Neuromuscular re-ed: R soleus strengthening over 4 in step 10# DB 15x2 10x STS from airex pad 10# DB Runners step 4 in 10/10 Therapeutic Activity: Slant board stretch gastroc/soleus 30s x2 ea. 4 way ankle RTB 15x Static stance on airex EC 30s Tandem stance on airex 30s each position  [facility] Adult PT Treatment:                                                DATE: 11/12/23 Therapeutic Exercise: RB A/P static 60s RB A/P dynamic 60s Step ups onto RB with R lifting/lowering Neuromuscular re-ed: 10x STS from airex pad Heel raises against wall 15x Toe raises against wall 15x Therapeutic Activity: Re-assessment of ROM, mobility and function  [facility] Adult PT Treatment:  DATE: 10/21/23 Therapeutic Activity: Re-assessment of ROM, strength, balance and palpation with point tenderness elicited at  origin of plantar fascia.  Unable to perform SL heel raise R due to pain/weakness.  Self Care: Discussion of POC regarding ongoing symptoms, lack of imaging to date and pending MRI.  Agree to place PT on hold until imaging obtained.  [facility] Adult PT Treatment:                                                DATE: 10/19/23 Therapeutic Exercise: Nustep L4 8 min Neuromuscular re-ed: Runners step from 4 in 10/10 Talocrural and subtalar ankle mobs to promote mobility and ROM 5x10 Therapeutic Activity: Gastroc/soleus stretch 30s x2 ea. Heel raises from 4 in step 10x2 4 way ankle YTB 15x  [facility] Adult PT Treatment:                                                DATE:10/12/2023 Therapeutic Activity: NuStep 8' for activity tolerance Slant board gastroc stretch 2x1' Slant board on 6 box soleus stretch 2x1' Split stance great toe isolation 2x15 Standing heel raise 2x12, hold 2s  [facility] Adult PT Treatment:                                                DATE: 10/09/23 Therapeutic Exercise: Nustep L2 8 min Manual Therapy: Talocrural and subtalar ankle mobs to promote mobility and ROM 5x10 Therapeutic Activity: PF Stretch 60s Seated INV/EV over towel 60s 4 way ankle YTB 15x2 Runners step 4 in 10/10  [facility] Adult PT Treatment:                                                DATE: 10/05/23 Eval and HEP Self Care: Additional minutes spent for educating on updated Therapeutic Home Exercise Program as well as comparing current status to condition at start of symptoms. This included exercises focusing on stretching, strengthening, with focus on eccentric aspects. Long term goals include an improvement in range of motion, strength, endurance as well as avoiding reinjury. Patient's frequency would include in 1-2 times a day, 3-5 times a week for a duration of 6-12 weeks. Proper technique shown and discussed handout in great detail. All questions were discussed and addressed.      PATIENT EDUCATION:   Education details: Discussed eval findings, rehab rationale and POC and patient is in agreement  Person educated: Patient Education method: Explanation and Handouts Education comprehension: verbalized understanding and needs further education  HOME EXERCISE PROGRAM: Access Code: [email or web address] URL: [email or web address] Date: 11/12/2023 Prepared by: Reyes Kohut  Exercises - Long Sitting Plantar Fascia Stretch with Towel  - 2-3 x daily - 5 x weekly - 1 sets - 2 reps - 30s hold - Ankle Inversion Eversion Towel Slide  - 2-3 x daily - 5 x weekly - 2 sets - 15 reps - Single Leg Stance with Support  - 2-3 x daily - 5 x weekly -  2 sets - 2 reps - 15s hold - Standing Heel Raise with Support  - 2-3 x daily - 5 x weekly - 1 sets - 10 reps - Toe Raise With Back Against Wall  - 2-3 x daily - 5 x weekly - 1 sets - 10 reps  ASSESSMENT:  CLINICAL IMPRESSION: Minimal gains in pain or function reported but does feel she is more steady on her feet.  Focus of session was continued strengthening and advancing to additional balance and proprioceptive work.  TP at R cuboid.  Increased resistance on tasks as noted.    Patient is a 78 y.o. female who was seen today for physical therapy evaluation and treatment for R plantar fascia tear.  Patient presents with mild ROM and strength deficits in R ankle as well as TTP at origin of plantar fascia.  Pain limits prolonged standing and WB.     OBJECTIVE IMPAIRMENTS: Abnormal gait, decreased activity tolerance, decreased balance, decreased knowledge of condition, decreased mobility, difficulty walking, decreased strength, increased fascial restrictions, improper body mechanics, and pain.   ACTIVITY LIMITATIONS: standing, squatting, and stairs  PERSONAL FACTORS: Age, Fitness, Past/current experiences, and Time since onset of injury/illness/exacerbation are also affecting patient's functional outcome.   REHAB POTENTIAL: Good  CLINICAL DECISION MAKING:  Evolving/moderate complexity  EVALUATION COMPLEXITY: Low   GOALS: Goals reviewed with patient? No  SHORT TERM GOALS: Target date: 10/26/2023   Patient to demonstrate independence in HEP  Baseline: [email or web address] Goal status: INITIAL  2.  247ft ambulation w/o AD Baseline: 47ft w/o AD Goal status: INITIAL  LONG TERM GOALS: Target date: 11/16/2023    Patient will acknowledge 2/10 pain at least once during episode of care  Baseline: 5/10 Goal status: INITIAL  2.  Patient will score at least 40/80 on LEFS to signify clinically meaningful improvement in functional abilities.   Baseline: 21/80 Goal status: INITIAL  3.  Patient will increase 30s chair stand reps from 7 to 10 without arms to demonstrate and improved functional ability with less pain/difficulty as well as reduce fall risk. Baseline: 7; 10/21/23 8 Goal status: INITIAL  4.  Increase R ankle strength to 4/5 Baseline:  MMT Right eval Left eval  Hip flexion    Hip extension    Hip abduction    Hip adduction    Hip internal rotation    Hip external rotation    Knee flexion    Knee extension    Ankle dorsiflexion 4-   Ankle plantarflexion 3   Ankle inversion 4-   Ankle eversion 4-    Goal status: INITIAL  5.  Increase AROM DF to 8d Baseline:  Active ROM Right eval Left eval  Hip flexion    Hip extension    Hip abduction    Hip adduction    Hip internal rotation    Hip external rotation    Knee flexion    Knee extension    Ankle dorsiflexion 5d 5d  Ankle plantarflexion    Ankle inversion 20d 30d  Ankle eversion 15d 20d   Goal status: INITIAL     PLAN:  PT FREQUENCY: 1-2x/week  PT DURATION: 6 weeks  PLANNED INTERVENTIONS: 97110-Therapeutic exercises, 97530- Therapeutic activity, 97112- Neuromuscular re-education, 97535- Self Care, [id number]- Manual therapy, [phone]- Gait training, Balance training, Stair training, and Joint mobilization  PLAN FOR NEXT SESSION: HEP review and update, manual techniques  as appropriate, aerobic tasks, ROM and flexibility activities, strengthening and PREs, TPDN, gait and balance training as needed  Jeff Nastasha Reising PT  11/17/2023, 1:11 PM

## 2023-11-17 ENCOUNTER — Ambulatory Visit

## 2023-11-17 DIAGNOSIS — M6281 Muscle weakness (generalized): Secondary | ICD-10-CM | POA: Diagnosis not present

## 2023-11-17 DIAGNOSIS — R2681 Unsteadiness on feet: Secondary | ICD-10-CM | POA: Diagnosis not present

## 2023-11-17 DIAGNOSIS — M722 Plantar fascial fibromatosis: Secondary | ICD-10-CM | POA: Diagnosis not present

## 2023-11-19 ENCOUNTER — Ambulatory Visit

## 2023-11-24 ENCOUNTER — Encounter

## 2023-11-25 NOTE — Therapy (Unsigned)
 OUTPATIENT PHYSICAL THERAPY TREATMENT NOTE   Patient Name: Lindsay Peck MRN: [id number] DOB:1945/07/25, 78 y.o., female Today's Date: 11/26/2023  END OF SESSION:  PT End of Session - 11/26/23 1405     Visit Number 8    Number of Visits 12    Date for PT Re-Evaluation 12/05/23    Authorization Type [employer] MCR    PT Start Time 1400    PT Stop Time 1445    PT Time Calculation (min) 45 min    Activity Tolerance Patient tolerated treatment well;Patient limited by pain    Behavior During Therapy WFL for tasks assessed/performed               Past Medical History:  Diagnosis Date   Arthritis    In hand   Cancer (HCC)    Precancerous on hand (Bilaterally)   Hypertension    Right arm fracture    Urinary retention    Past Surgical History:  Procedure Laterality Date   EYE SURGERY Bilateral    Cataract   PUBOVAGINAL SLING N/A 07/15/2019   Procedure: CARLOYN GLADE;  Surgeon: Cam Morene ORN, MD;  Location: [facility];  Service: Urology;  Laterality: N/A;   ROBOTIC ASSISTED LAPAROSCOPIC SACROCOLPOPEXY N/A 07/15/2019   Procedure: XI ROBOTIC ASSISTED LAPAROSCOPIC SACROCOLPOPEXY,SUPRACERVICAL HYSTERECTOMY;  Surgeon: Cam Morene ORN, MD;  Location: [facility];  Service: Urology;  Laterality: N/A;   TUBAL LIGATION     Patient Active Problem List   Diagnosis Date Noted   Dizziness 11/15/2021   Ear pressure, right 11/15/2021   Temporomandibular jaw dysfunction 11/15/2021   Cystocele with prolapse 07/15/2019   Status post arthroscopy of right knee 10/22/2016   Acute pain of right knee 08/25/2016   Complex tear of medial meniscus of right knee as current injury 08/25/2016    PCP: Cleotilde Planas, MD   REFERRING PROVIDER: Gershon Donnice SAUNDERS, DPM  REFERRING DIAG: M62.9 (ICD-10-CM) - Nontraumatic tear of plantar fascia  THERAPY DIAG:  Plantar fasciitis of right foot  Unsteadiness on feet  Muscle weakness (generalized)  Rationale for Evaluation and Treatment:  Rehabilitation  ONSET DATE: 08/26/23  SUBJECTIVE:   SUBJECTIVE STATEMENT: Continues with unchanging symptoms.  Will f/u with MD 11/27/23 to discuss MRI and upcoming ECSW therapy  PERTINENT HISTORY: Right plantar fascial tear -Ice, discussed gradual transition back to regular shoe as tolerated.  Discussed icing, stretching exercises.  Referral to physical therapy sent to [facility].  If her symptoms continue or worsen recommend to go back into the walking boot.   Return in about 6 weeks (around 11/06/2023) for plantar fascia tear, follow up from physical therapy . PAIN:  Are you having pain? Yes: NPRS scale: 5/10 Pain location: R arch Pain description: ache Aggravating factors: weight bearing Relieving factors: rest  PRECAUTIONS: None  RED FLAGS: None   WEIGHT BEARING RESTRICTIONS: No  FALLS:  Has patient fallen in last 6 months? No  OCCUPATION: retired  PLOF: Independent  PATIENT GOALS: To manage my R foot pain  NEXT MD VISIT: 6 weeks  OBJECTIVE:  Note: Objective measures were completed at Evaluation unless otherwise noted.  DIAGNOSTIC FINDINGS: none available 11/12/23 IMPRESSION: Mild edema in the cuboid which may be related to degenerative edema or a stress reaction. There is no displaced fracture identified. Correlation for lateral midfoot/hindfoot pain.   Mild edema in the plantar aspect of the calcaneus with thickening of the medial lateral fascia consistent with plantar fasciitis.   Electronically signed by: Norleen Satchel MD 11/06/2023  11:36 AM EDT RP Workstation: [id number]  PATIENT SURVEYS:  LEFS 21/80   MUSCLE LENGTH: Not tested  POSTURE: No Significant postural limitations  PALPATION: TTP plantar fascia origin  LOWER EXTREMITY ROM:  Active ROM Right eval Left eval R  11/12/23  Hip flexion     Hip extension     Hip abduction     Hip adduction     Hip internal rotation     Hip external rotation     Knee flexion     Knee  extension     Ankle dorsiflexion 5d 5d 8/12d  Ankle plantarflexion     Ankle inversion 20d 30d   Ankle eversion 15d 20d    (Blank rows = not tested)  LOWER EXTREMITY MMT:  MMT Right eval Left eval  Hip flexion    Hip extension    Hip abduction    Hip adduction    Hip internal rotation    Hip external rotation    Knee flexion    Knee extension    Ankle dorsiflexion 4-   Ankle plantarflexion 3   Ankle inversion 4-   Ankle eversion 4-    (Blank rows = not tested)  LOWER EXTREMITY SPECIAL TESTS:  deferred  FUNCTIONAL TESTS:  30 seconds chair stand test 7 reps; 10/21/23 8 reps 10/21/23 SLS 13s L, 8s R 11/12/23 SLS 15s L, 13s R  GAIT: Distance walked: 6ft x2 Assistive device utilized: None Level of assistance: Complete Independence Comments: antalgic with decreased step length L                                                                                                                                TREATMENT: [facility] Adult PT Treatment:                                                DATE: 11/26/23 Therapeutic Exercise: Nustep L6 8 min 40 SPM Neuromuscular re-ed: R soleus strengthening over 4 in step 15# DB 15x2 10x STS from airex pad 10# DB Runners step 4 in 10/10 5# KB Therapeutic Activity: 4 way ankle RTB 15x2 Static stance on 4 in step with P-ball circles 30x ea  [facility] Adult PT Treatment:                                                DATE: 11/17/23 Therapeutic Exercise: Nustep L5 8 min 40 SPM Neuromuscular re-ed: R soleus strengthening over 4 in step 10# DB 15x2 10x STS from airex pad 10# DB Runners step 4 in 10/10 Therapeutic Activity: Slant board stretch gastroc/soleus 30s x2 ea. 4 way ankle RTB 15x Static stance on airex EC 30s Tandem  stance on airex 30s each position  [facility] Adult PT Treatment:                                                DATE: 11/12/23 Therapeutic Exercise: RB A/P static 60s RB A/P dynamic 60s Step ups onto RB with R  lifting/lowering Neuromuscular re-ed: 10x STS from airex pad Heel raises against wall 15x Toe raises against wall 15x Therapeutic Activity: Re-assessment of ROM, mobility and function  [facility] Adult PT Treatment:                                                DATE: 10/21/23 Therapeutic Activity: Re-assessment of ROM, strength, balance and palpation with point tenderness elicited at origin of plantar fascia.  Unable to perform SL heel raise R due to pain/weakness.  Self Care: Discussion of POC regarding ongoing symptoms, lack of imaging to date and pending MRI.  Agree to place PT on hold until imaging obtained.  [facility] Adult PT Treatment:                                                DATE: 10/19/23 Therapeutic Exercise: Nustep L4 8 min Neuromuscular re-ed: Runners step from 4 in 10/10 Talocrural and subtalar ankle mobs to promote mobility and ROM 5x10 Therapeutic Activity: Gastroc/soleus stretch 30s x2 ea. Heel raises from 4 in step 10x2 4 way ankle YTB 15x  [facility] Adult PT Treatment:                                                DATE:10/12/2023 Therapeutic Activity: NuStep 8' for activity tolerance Slant board gastroc stretch 2x1' Slant board on 6 box soleus stretch 2x1' Split stance great toe isolation 2x15 Standing heel raise 2x12, hold 2s  [facility] Adult PT Treatment:                                                DATE: 10/09/23 Therapeutic Exercise: Nustep L2 8 min Manual Therapy: Talocrural and subtalar ankle mobs to promote mobility and ROM 5x10 Therapeutic Activity: PF Stretch 60s Seated INV/EV over towel 60s 4 way ankle YTB 15x2 Runners step 4 in 10/10  [facility] Adult PT Treatment:                                                DATE: 10/05/23 Eval and HEP Self Care: Additional minutes spent for educating on updated Therapeutic Home Exercise Program as well as comparing current status to condition at start of symptoms. This included exercises focusing on stretching,  strengthening, with focus on eccentric aspects. Long term goals include an improvement in range of motion, strength, endurance as well as avoiding  reinjury. Patient's frequency would include in 1-2 times a day, 3-5 times a week for a duration of 6-12 weeks. Proper technique shown and discussed handout in great detail. All questions were discussed and addressed.      PATIENT EDUCATION:  Education details: Discussed eval findings, rehab rationale and POC and patient is in agreement  Person educated: Patient Education method: Explanation and Handouts Education comprehension: verbalized understanding and needs further education  HOME EXERCISE PROGRAM: Access Code: [email or web address] URL: [email or web address] Date: 11/12/2023 Prepared by: Reyes Kohut  Exercises - Long Sitting Plantar Fascia Stretch with Towel  - 2-3 x daily - 5 x weekly - 1 sets - 2 reps - 30s hold - Ankle Inversion Eversion Towel Slide  - 2-3 x daily - 5 x weekly - 2 sets - 15 reps - Single Leg Stance with Support  - 2-3 x daily - 5 x weekly - 2 sets - 2 reps - 15s hold - Standing Heel Raise with Support  - 2-3 x daily - 5 x weekly - 1 sets - 10 reps - Toe Raise With Back Against Wall  - 2-3 x daily - 5 x weekly - 1 sets - 10 reps  ASSESSMENT:  CLINICAL IMPRESSION: Continued with strengthening of R ankle as well as balance and proprioceptive training. Pain remains limiting factor.  Patient to f/u with MD and undergo ECSWT before returning to [facility]    Patient is a 78 y.o. female who was seen today for physical therapy evaluation and treatment for R plantar fascia tear.  Patient presents with mild ROM and strength deficits in R ankle as well as TTP at origin of plantar fascia.  Pain limits prolonged standing and WB.     OBJECTIVE IMPAIRMENTS: Abnormal gait, decreased activity tolerance, decreased balance, decreased knowledge of condition, decreased mobility, difficulty walking, decreased strength, increased fascial  restrictions, improper body mechanics, and pain.   ACTIVITY LIMITATIONS: standing, squatting, and stairs  PERSONAL FACTORS: Age, Fitness, Past/current experiences, and Time since onset of injury/illness/exacerbation are also affecting patient's functional outcome.   REHAB POTENTIAL: Good  CLINICAL DECISION MAKING: Evolving/moderate complexity  EVALUATION COMPLEXITY: Low   GOALS: Goals reviewed with patient? No  SHORT TERM GOALS: Target date: 10/26/2023   Patient to demonstrate independence in HEP  Baseline: [email or web address] Goal status: INITIAL  2.  266ft ambulation w/o AD Baseline: 50ft w/o AD Goal status: INITIAL  LONG TERM GOALS: Target date: 11/16/2023    Patient will acknowledge 2/10 pain at least once during episode of care  Baseline: 5/10 Goal status: INITIAL  2.  Patient will score at least 40/80 on LEFS to signify clinically meaningful improvement in functional abilities.   Baseline: 21/80 Goal status: INITIAL  3.  Patient will increase 30s chair stand reps from 7 to 10 without arms to demonstrate and improved functional ability with less pain/difficulty as well as reduce fall risk. Baseline: 7; 10/21/23 8 Goal status: INITIAL  4.  Increase R ankle strength to 4/5 Baseline:  MMT Right eval Left eval  Hip flexion    Hip extension    Hip abduction    Hip adduction    Hip internal rotation    Hip external rotation    Knee flexion    Knee extension    Ankle dorsiflexion 4-   Ankle plantarflexion 3   Ankle inversion 4-   Ankle eversion 4-    Goal status: INITIAL  5.  Increase AROM DF to 8d Baseline:  Active ROM Right eval Left eval  Hip flexion    Hip extension    Hip abduction    Hip adduction    Hip internal rotation    Hip external rotation    Knee flexion    Knee extension    Ankle dorsiflexion 5d 5d  Ankle plantarflexion    Ankle inversion 20d 30d  Ankle eversion 15d 20d   Goal status: INITIAL     PLAN:  PT FREQUENCY: 1-2x/week  PT  DURATION: 6 weeks  PLANNED INTERVENTIONS: 97110-Therapeutic exercises, 97530- Therapeutic activity, 97112- Neuromuscular re-education, 97535- Self Care, [id number]- Manual therapy, [phone]- Gait training, Balance training, Stair training, and Joint mobilization  PLAN FOR NEXT SESSION: HEP review and update, manual techniques as appropriate, aerobic tasks, ROM and flexibility activities, strengthening and PREs, TPDN, gait and balance training as needed   Jeff Amyria Komar PT  11/26/2023, 3:13 PM

## 2023-11-26 ENCOUNTER — Ambulatory Visit

## 2023-11-26 DIAGNOSIS — R2681 Unsteadiness on feet: Secondary | ICD-10-CM | POA: Diagnosis not present

## 2023-11-26 DIAGNOSIS — M722 Plantar fascial fibromatosis: Secondary | ICD-10-CM | POA: Diagnosis not present

## 2023-11-26 DIAGNOSIS — M6281 Muscle weakness (generalized): Secondary | ICD-10-CM | POA: Diagnosis not present

## 2023-11-27 ENCOUNTER — Ambulatory Visit: Admitting: Podiatry

## 2023-11-27 VITALS — Ht 60.0 in | Wt 171.0 lb

## 2023-11-27 DIAGNOSIS — L600 Ingrowing nail: Secondary | ICD-10-CM | POA: Diagnosis not present

## 2023-11-27 DIAGNOSIS — M722 Plantar fascial fibromatosis: Secondary | ICD-10-CM | POA: Diagnosis not present

## 2023-11-27 DIAGNOSIS — T148XXA Other injury of unspecified body region, initial encounter: Secondary | ICD-10-CM | POA: Diagnosis not present

## 2023-11-27 DIAGNOSIS — M629 Disorder of muscle, unspecified: Secondary | ICD-10-CM | POA: Diagnosis not present

## 2023-11-27 NOTE — Progress Notes (Signed)
  Subjective:  Patient ID: Lindsay Peck, female    DOB: 1946-03-11,  MRN: [id number] Chief Complaint  Patient presents with   Foot Pain    RM 11 Patient is here to follow-up on MRI results of the right foot plantar fascia tear.    78 y.o. female presents with the above complaint.  She states this heel still hurts at times in certain positions or sitting.  Does not hurt much with walking.  She is it hurts only when in certain positions.  She also thinks that she may have an ingrown toenail on the big toe.  No swelling or redness or drainage.  Objective:  General: AAO x3, NAD-present with son  Dermatological: Ingrown toenail present along the left hallux without any drainage or pus.  Tenderness palpation.  No open lesions.  No signs of infection.  Vascular: Dorsalis Pedis artery and Posterior Tibial artery pedal pulses are 2/4 bilateral with immedate capillary fill time.  There is no pain with calf compression, swelling, warmth, erythema.   Neruologic: Grossly intact via light touch bilateral.   Musculoskeletal: She has continued discomfort on the plantar aspect of calcaneus on insertion of plantar fascia as well as in the arch of the foot on the plantar fascia.  This is mostly along the medial band but she also has some discomfort lateral aspect.  She does get discomfort along the cuboid area laterally as well.  There is no area pinpoint tenderness identified  Gait: Unassisted, Nonantalgic.    Assessment:   Plantar fasciitis, contusion, ingrown toenail Plan:  Patient was evaluated and treated and all questions answered.  Possible right plantar fascial tear; bone contusion with plantar fasciitis -MRI shows plantar fasciitis and also bone contusion.  We reviewed the MRI findings today.  Right continue physical therapy we discussed shoes, good support.  We discussed continued anti-inflammatories and home stretching, rehab exercises as well.  Ingrown toenail - Debrided without any  complications or bleeding.  Consider partial nail removal if needed.  Monitor for any signs or symptoms of infection.  Return in about 4 weeks (around 12/25/2023).  Donnice JONELLE Fees DPM \

## 2023-12-01 ENCOUNTER — Other Ambulatory Visit

## 2023-12-01 NOTE — Therapy (Deleted)
 OUTPATIENT PHYSICAL THERAPY TREATMENT NOTE   Lindsay Peck Name: Lindsay Peck MRN: [id number] DOB:13-Sep-1945, 78 y.o., female Today's Date: 12/01/2023  END OF SESSION:         Past Medical History:  Diagnosis Date   Arthritis    In hand   Cancer (HCC)    Precancerous on hand (Bilaterally)   Hypertension    Right arm fracture    Urinary retention    Past Surgical History:  Procedure Laterality Date   EYE SURGERY Bilateral    Cataract   PUBOVAGINAL SLING N/A 07/15/2019   Procedure: CARLOYN GLADE;  Surgeon: Cam Morene ORN, MD;  Location: [facility];  Service: Urology;  Laterality: N/A;   ROBOTIC ASSISTED LAPAROSCOPIC SACROCOLPOPEXY N/A 07/15/2019   Procedure: XI ROBOTIC ASSISTED LAPAROSCOPIC SACROCOLPOPEXY,SUPRACERVICAL HYSTERECTOMY;  Surgeon: Cam Morene ORN, MD;  Location: [facility];  Service: Urology;  Laterality: N/A;   TUBAL LIGATION     Lindsay Peck Active Problem List   Diagnosis Date Noted   Dizziness 11/15/2021   Ear pressure, right 11/15/2021   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  Rationale for Evaluation and Treatment: Rehabilitation  ONSET DATE: 08/26/23  SUBJECTIVE:   SUBJECTIVE STATEMENT: Continues with unchanging symptoms.  Will f/u with MD 11/27/23 to discuss MRI and upcoming ECSW therapy  PERTINENT HISTORY: Right plantar fascial tear -Ice, discussed gradual transition back to regular shoe as tolerated.  Discussed icing, stretching exercises.  Referral to physical therapy sent [facility].  If her symptoms continue or worsen recommend to go  back into the walking boot.   Return in about 6 weeks (around 11/06/2023) for plantar fascia tear, follow up from physical therapy . PAIN:  Are you having pain? Yes: NPRS scale: 5/10 Pain location: R arch Pain description: ache Aggravating factors: weight bearing Relieving factors: rest  PRECAUTIONS: None  RED FLAGS: None   WEIGHT BEARING RESTRICTIONS: No  FALLS:  Has Lindsay Peck fallen in last 6 months? No  OCCUPATION: retired  PLOF: Independent  Lindsay Peck GOALS: To manage my R foot pain  NEXT MD VISIT: 6 weeks  OBJECTIVE:  Note: Objective measures were completed at Evaluation unless otherwise noted.  DIAGNOSTIC FINDINGS: none available 11/12/23 IMPRESSION: Mild edema in the cuboid which may be related to degenerative edema or a stress reaction. There is no displaced fracture identified. Correlation for lateral midfoot/hindfoot pain.   Mild edema in the plantar aspect of the calcaneus with thickening of the medial lateral fascia consistent with plantar fasciitis.   Electronically signed by: Norleen Satchel MD 11/06/2023 11:36 AM EDT RP Workstation: [id number]  Lindsay Peck SURVEYS:  LEFS 21/80   MUSCLE LENGTH: Not tested  POSTURE: No Significant postural limitations  PALPATION: TTP plantar fascia origin  LOWER EXTREMITY ROM:  Active ROM Right eval Left eval R  11/12/23  Hip flexion     Hip extension     Hip abduction     Hip adduction     Hip internal rotation     Hip external rotation     Knee flexion     Knee extension     Ankle dorsiflexion 5d  5d 8/12d  Ankle plantarflexion     Ankle inversion 20d 30d   Ankle eversion 15d 20d    (Blank rows = not tested)  LOWER EXTREMITY MMT:  MMT Right eval Left eval  Hip flexion    Hip extension    Hip abduction    Hip adduction    Hip internal rotation    Hip external rotation    Knee flexion    Knee extension    Ankle dorsiflexion 4-   Ankle plantarflexion 3   Ankle inversion 4-   Ankle eversion 4-     (Blank rows = not tested)  LOWER EXTREMITY SPECIAL TESTS:  deferred  FUNCTIONAL TESTS:  30 seconds chair stand test 7 reps; 10/21/23 8 reps 10/21/23 SLS 13s L, 8s R 11/12/23 SLS 15s L, 13s R  GAIT: Distance walked: 83ft x2 Assistive device utilized: None Level of assistance: Complete Independence Comments: antalgic with decreased step length L                                                                                                                                TREATMENT: [facility] Adult PT Treatment:                                                DATE: 11/26/23 Therapeutic Exercise: Nustep L6 8 min 40 SPM Neuromuscular re-ed: R soleus strengthening over 4 in step 15# DB 15x2 10x STS from airex pad 10# DB Runners step 4 in 10/10 5# KB Therapeutic Activity: 4 way ankle RTB 15x2 Static stance on 4 in step with P-ball circles 30x ea  [facility] Adult PT Treatment:                                                DATE: 11/17/23 Therapeutic Exercise: Nustep L5 8 min 40 SPM Neuromuscular re-ed: R soleus strengthening over 4 in step 10# DB 15x2 10x STS from airex pad 10# DB Runners step 4 in 10/10 Therapeutic Activity: Slant board stretch gastroc/soleus 30s x2 ea. 4 way ankle RTB 15x Static stance on airex EC 30s Tandem stance on airex 30s each position  [facility] Adult PT Treatment:                                                DATE: 11/12/23 Therapeutic Exercise: RB A/P static 60s RB A/P dynamic 60s Step ups onto RB with R lifting/lowering Neuromuscular re-ed: 10x STS from airex pad Heel raises against wall 15x Toe raises against wall 15x Therapeutic Activity: Re-assessment of  ROM, mobility and function  [facility] Adult PT Treatment:                                                DATE: 10/21/23 Therapeutic Activity: Re-assessment of ROM, strength, balance and palpation with point tenderness elicited at origin of plantar fascia.  Unable to perform SL heel raise R due to pain/weakness.   Self Care: Discussion of POC regarding ongoing symptoms, lack of imaging to date and pending MRI.  Agree to place PT on hold until imaging obtained.  [facility] Adult PT Treatment:                                                DATE: 10/19/23 Therapeutic Exercise: Nustep L4 8 min Neuromuscular re-ed: Runners step from 4 in 10/10 Talocrural and subtalar ankle mobs to promote mobility and ROM 5x10 Therapeutic Activity: Gastroc/soleus stretch 30s x2 ea. Heel raises from 4 in step 10x2 4 way ankle YTB 15x  [facility] Adult PT Treatment:                                                DATE:10/12/2023 Therapeutic Activity: NuStep 8' for activity tolerance Slant board gastroc stretch 2x1' Slant board on 6 box soleus stretch 2x1' Split stance great toe isolation 2x15 Standing heel raise 2x12, hold 2s  [facility] Adult PT Treatment:                                                DATE: 10/09/23 Therapeutic Exercise: Nustep L2 8 min Manual Therapy: Talocrural and subtalar ankle mobs to promote mobility and ROM 5x10 Therapeutic Activity: PF Stretch 60s Seated INV/EV over towel 60s 4 way ankle YTB 15x2 Runners step 4 in 10/10  [facility] Adult PT Treatment:                                                DATE: 10/05/23 Eval and HEP Self Care: Additional minutes spent for educating on updated Therapeutic Home Exercise Program as well as comparing current status to condition at start of symptoms. This included exercises focusing on stretching, strengthening, with focus on eccentric aspects. Long term goals include an improvement in range of motion, strength, endurance as well as avoiding reinjury. Lindsay Peck's frequency would include in 1-2 times a day, 3-5 times a week for a duration of 6-12 weeks. Proper technique shown and discussed handout in great detail. All questions were discussed and addressed.      Lindsay Peck EDUCATION:  Education details: Discussed eval findings, rehab rationale and POC and Lindsay Peck is in  agreement  Person educated: Lindsay Peck Education method: Explanation and Handouts Education comprehension: verbalized understanding and needs further education  HOME EXERCISE PROGRAM: Access Code: [email or web address] URL: [email or web address] Date: 11/12/2023 Prepared by: Reyes Kohut  Exercises - Long Sitting Plantar Fascia Stretch with  Towel  - 2-3 x daily - 5 x weekly - 1 sets - 2 reps - 30s hold - Ankle Inversion Eversion Towel Slide  - 2-3 x daily - 5 x weekly - 2 sets - 15 reps - Single Leg Stance with Support  - 2-3 x daily - 5 x weekly - 2 sets - 2 reps - 15s hold - Standing Heel Raise with Support  - 2-3 x daily - 5 x weekly - 1 sets - 10 reps - Toe Raise With Back Against Wall  - 2-3 x daily - 5 x weekly - 1 sets - 10 reps  ASSESSMENT:  CLINICAL IMPRESSION: Continued with strengthening of R ankle as well as balance and proprioceptive training. Pain remains limiting factor.  Lindsay Peck to f/u with MD and undergo ECSWT before returning to OPPT    Lindsay Peck is a 78 y.o. female who was seen today for physical therapy evaluation and treatment for R plantar fascia tear.  Lindsay Peck presents with mild ROM and strength deficits in R ankle as well as TTP at origin of plantar fascia.  Pain limits prolonged standing and WB.     OBJECTIVE IMPAIRMENTS: Abnormal gait, decreased activity tolerance, decreased balance, decreased knowledge of condition, decreased mobility, difficulty walking, decreased strength, increased fascial restrictions, improper body mechanics, and pain.   ACTIVITY LIMITATIONS: standing, squatting, and stairs  PERSONAL FACTORS: Age, Fitness, Past/current experiences, and Time since onset of injury/illness/exacerbation are also affecting Lindsay Peck's functional outcome.   REHAB POTENTIAL: Good  CLINICAL DECISION MAKING: Evolving/moderate complexity  EVALUATION COMPLEXITY: Low   GOALS: Goals reviewed with Lindsay Peck? No  SHORT TERM GOALS: Target date: 10/26/2023    Lindsay Peck to demonstrate independence in HEP  Baseline: [email or web address] Goal status: INITIAL  2.  211ft ambulation w/o AD Baseline: 64ft w/o AD Goal status: INITIAL  LONG TERM GOALS: Target date: 11/16/2023    Lindsay Peck will acknowledge 2/10 pain at least once during episode of care  Baseline: 5/10 Goal status: INITIAL  2.  Lindsay Peck will score at least 40/80 on LEFS to signify clinically meaningful improvement in functional abilities.   Baseline: 21/80 Goal status: INITIAL  3.  Lindsay Peck will increase 30s chair stand reps from 7 to 10 without arms to demonstrate and improved functional ability with less pain/difficulty as well as reduce fall risk. Baseline: 7; 10/21/23 8 Goal status: INITIAL  4.  Increase R ankle strength to 4/5 Baseline:  MMT Right eval Left eval  Hip flexion    Hip extension    Hip abduction    Hip adduction    Hip internal rotation    Hip external rotation    Knee flexion    Knee extension    Ankle dorsiflexion 4-   Ankle plantarflexion 3   Ankle inversion 4-   Ankle eversion 4-    Goal status: INITIAL  5.  Increase AROM DF to 8d Baseline:  Active ROM Right eval Left eval  Hip flexion    Hip extension    Hip abduction    Hip adduction    Hip internal rotation    Hip external rotation    Knee flexion    Knee extension    Ankle dorsiflexion 5d 5d  Ankle plantarflexion    Ankle inversion 20d 30d  Ankle eversion 15d 20d   Goal status: INITIAL     PLAN:  PT FREQUENCY: 1-2x/week  PT DURATION: 6 weeks  PLANNED INTERVENTIONS: 97110-Therapeutic exercises, 97530- Therapeutic activity, [id number]- Neuromuscular re-education, 97535- Self Care, [id number]- Manual therapy,  [id number]- Gait training, Balance training, Stair training, and Joint mobilization  PLAN FOR NEXT SESSION: HEP review and update, manual techniques as appropriate, aerobic tasks, ROM and flexibility activities, strengthening and PREs, TPDN, gait and balance training as needed   Jeff Tykera Skates PT   12/01/2023, 3:13 PM

## 2023-12-02 ENCOUNTER — Ambulatory Visit (INDEPENDENT_AMBULATORY_CARE_PROVIDER_SITE_OTHER)

## 2023-12-02 DIAGNOSIS — M722 Plantar fascial fibromatosis: Secondary | ICD-10-CM

## 2023-12-02 NOTE — Patient Instructions (Signed)

## 2023-12-02 NOTE — Progress Notes (Signed)
 Patient presents for the 1st EPAT treatment today with complaint of right heel pain. Diagnosed with plantar fascitis by Dr. Gershon. This has been ongoing for several months. The patient has tried ice, stretching, NSAIDS and supportive shoe gear with no long term relief.   Most of the pain is located in the right heel and lateral side of foot. Patient was originally scheduled for 12/01/2023, due to power outage she as rescheduled to today. Her PT appointment for tomorrow will be rescheduled per patient.   ESWT administered and tolerated well.Treatment settings initiated at:   Energy: 50  Ended treatment session today with 2500 shocks at the following settings:   Energy: 60  Frequency: 10 hz   Reviewed post EPAT instructions. Advised to avoid ice and NSAIDs throughout the treatment process and to utilize boot or supportive shoes for at least the next 3 days.  Follow up for 2nd treatment in 1 week.

## 2023-12-03 ENCOUNTER — Ambulatory Visit

## 2023-12-08 NOTE — Therapy (Signed)
 OUTPATIENT PHYSICAL THERAPY TREATMENT NOTE   Patient Name: Lindsay Peck MRN: [id number] DOB:January 04, 1946, 78 y.o., female Today's Date: 12/09/2023  END OF SESSION:  PT End of Session - 12/09/23 1221     Visit Number 9    Number of Visits 12    Date for PT Re-Evaluation 12/05/23    Authorization Type [employer] MCR    PT Start Time 1215    Activity Tolerance Patient tolerated treatment well;Patient limited by pain    Behavior During Therapy [facility] for tasks assessed/performed                Past Medical History:  Diagnosis Date   Arthritis    In hand   Cancer (HCC)    Precancerous on hand (Bilaterally)   Hypertension    Right arm fracture    Urinary retention    Past Surgical History:  Procedure Laterality Date   EYE SURGERY Bilateral    Cataract   PUBOVAGINAL SLING N/A 07/15/2019   Procedure: CARLOYN GLADE;  Surgeon: Cam Morene ORN, MD;  Location: [facility];  Service: Urology;  Laterality: N/A;   ROBOTIC ASSISTED LAPAROSCOPIC SACROCOLPOPEXY N/A 07/15/2019   Procedure: XI ROBOTIC ASSISTED LAPAROSCOPIC SACROCOLPOPEXY,SUPRACERVICAL HYSTERECTOMY;  Surgeon: Cam Morene ORN, MD;  Location: [facility];  Service: Urology;  Laterality: N/A;   TUBAL LIGATION     Patient Active Problem List   Diagnosis Date Noted   Dizziness 11/15/2021   Ear pressure, right 11/15/2021   Temporomandibular jaw dysfunction 11/15/2021   Cystocele with prolapse 07/15/2019   Status post arthroscopy of right knee 10/22/2016   Acute pain of right knee 08/25/2016   Complex tear of medial meniscus of right knee as current injury 08/25/2016    PCP: Cleotilde Planas, MD   REFERRING PROVIDER: Gershon Donnice SAUNDERS, DPM  REFERRING DIAG: M62.9 (ICD-10-CM) - Nontraumatic tear of plantar fascia  THERAPY DIAG:  Plantar fasciitis of right foot  Unsteadiness on feet  Muscle weakness (generalized)  Rationale for Evaluation and Treatment: Rehabilitation  ONSET DATE: 08/26/23  SUBJECTIVE:    SUBJECTIVE STATEMENT: Symptoms persist.  Underwent first ECSWT treatment last week.  PERTINENT HISTORY: Right plantar fascial tear -Ice, discussed gradual transition back to regular shoe as tolerated.  Discussed icing, stretching exercises.  Referral to physical therapy [facility].  If her symptoms continue or worsen recommend to go back into the walking boot.   Return in about 6 weeks (around 11/06/2023) for plantar fascia tear, follow up from physical therapy . PAIN:  Are you having pain? Yes: NPRS scale: 5/10 Pain location: R arch Pain description: ache Aggravating factors: weight bearing Relieving factors: rest  PRECAUTIONS: None  RED FLAGS: None   WEIGHT BEARING RESTRICTIONS: No  FALLS:  Has patient fallen in last 6 months? No  OCCUPATION: retired  PLOF: Independent  PATIENT GOALS: To manage my R foot pain  NEXT MD VISIT: 6 weeks  OBJECTIVE:  Note: Objective measures were completed at Evaluation unless otherwise noted.  DIAGNOSTIC FINDINGS: none available 11/12/23 IMPRESSION: Mild edema in the cuboid which may be related to degenerative edema or a stress reaction. There is no displaced fracture identified. Correlation for lateral midfoot/hindfoot pain.   Mild edema in the plantar aspect of the calcaneus with thickening of the medial lateral fascia consistent with plantar fasciitis.   Electronically signed by: Norleen Satchel MD 11/06/2023 11:36 AM EDT RP Workstation: [id number]  PATIENT SURVEYS:  LEFS 21/80   MUSCLE LENGTH: Not tested  POSTURE: No Significant postural  limitations  PALPATION: TTP plantar fascia origin  LOWER EXTREMITY ROM:  Active ROM Right eval Left eval R  11/12/23  Hip flexion     Hip extension     Hip abduction     Hip adduction     Hip internal rotation     Hip external rotation     Knee flexion     Knee extension     Ankle dorsiflexion 5d 5d 8/12d  Ankle plantarflexion     Ankle inversion 20d  30d   Ankle eversion 15d 20d    (Blank rows = not tested)  LOWER EXTREMITY MMT:  MMT Right eval Left eval  Hip flexion    Hip extension    Hip abduction    Hip adduction    Hip internal rotation    Hip external rotation    Knee flexion    Knee extension    Ankle dorsiflexion 4-   Ankle plantarflexion 3   Ankle inversion 4-   Ankle eversion 4-    (Blank rows = not tested)  LOWER EXTREMITY SPECIAL TESTS:  deferred  FUNCTIONAL TESTS:  30 seconds chair stand test 7 reps; 10/21/23 8 reps 10/21/23 SLS 13s L, 8s R 11/12/23 SLS 15s L, 13s R  GAIT: Distance walked: 60ft x2 Assistive device utilized: None Level of assistance: Complete Independence Comments: antalgic with decreased step length L                                                                                                                                TREATMENT: [facility] Adult PT Treatment:                                                DATE: 12/09/23 Therapeutic Exercise: Nustep L6 8 min 45 SPM Neuromuscular re-ed: R soleus strengthening over 4 in step 20# DB 15x2 Side stepping against yellow band 2 trips  Therapeutic Activity: PF Stretch with towel 60s x2 4 way ankle GTB 15x2  [facility] Adult PT Treatment:                                                DATE: 11/26/23 Therapeutic Exercise: Nustep L6 8 min 40 SPM Neuromuscular re-ed: R soleus strengthening over 4 in step 15# DB 15x2 10x STS from airex pad 10# DB Runners step 4 in 10/10 5# KB Therapeutic Activity: 4 way ankle RTB 15x2 Static stance on 4 in step with P-ball circles 30x ea  [facility] Adult PT Treatment:  DATE: 11/17/23 Therapeutic Exercise: Nustep L5 8 min 40 SPM Neuromuscular re-ed: R soleus strengthening over 4 in step 10# DB 15x2 10x STS from airex pad 10# DB Runners step 4 in 10/10 Therapeutic Activity: Slant board stretch gastroc/soleus 30s x2 ea. 4 way ankle RTB 15x Static stance on airex EC  30s Tandem stance on airex 30s each position  [facility] Adult PT Treatment:                                                DATE: 11/12/23 Therapeutic Exercise: RB A/P static 60s RB A/P dynamic 60s Step ups onto RB with R lifting/lowering Neuromuscular re-ed: 10x STS from airex pad Heel raises against wall 15x Toe raises against wall 15x Therapeutic Activity: Re-assessment of ROM, mobility and function  [facility] Adult PT Treatment:                                                DATE: 10/21/23 Therapeutic Activity: Re-assessment of ROM, strength, balance and palpation with point tenderness elicited at origin of plantar fascia.  Unable to perform SL heel raise R due to pain/weakness.  Self Care: Discussion of POC regarding ongoing symptoms, lack of imaging to date and pending MRI.  Agree to place PT on hold until imaging obtained.  [facility] Adult PT Treatment:                                                DATE: 10/19/23 Therapeutic Exercise: Nustep L4 8 min Neuromuscular re-ed: Runners step from 4 in 10/10 Talocrural and subtalar ankle mobs to promote mobility and ROM 5x10 Therapeutic Activity: Gastroc/soleus stretch 30s x2 ea. Heel raises from 4 in step 10x2 4 way ankle YTB 15x  [facility] Adult PT Treatment:                                                DATE:10/12/2023 Therapeutic Activity: NuStep 8' for activity tolerance Slant board gastroc stretch 2x1' Slant board on 6 box soleus stretch 2x1' Split stance great toe isolation 2x15 Standing heel raise 2x12, hold 2s  [facility] Adult PT Treatment:                                                DATE: 10/09/23 Therapeutic Exercise: Nustep L2 8 min Manual Therapy: Talocrural and subtalar ankle mobs to promote mobility and ROM 5x10 Therapeutic Activity: PF Stretch 60s Seated INV/EV over towel 60s 4 way ankle YTB 15x2 Runners step 4 in 10/10  [facility] Adult PT Treatment:                                                DATE: 10/05/23 Eval and  HEP Self Care: Additional minutes spent for educating on updated Therapeutic Home Exercise Program as well as comparing current status to condition at start of symptoms. This included exercises focusing on stretching, strengthening, with focus on eccentric aspects. Long term goals include an improvement in range of motion, strength, endurance as well as avoiding reinjury. Patient's frequency would include in 1-2 times a day, 3-5 times a week for a duration of 6-12 weeks. Proper technique shown and discussed handout in great detail. All questions were discussed and addressed.      PATIENT EDUCATION:  Education details: Discussed eval findings, rehab rationale and POC and patient is in agreement  Person educated: Patient Education method: Explanation and Handouts Education comprehension: verbalized understanding and needs further education  HOME EXERCISE PROGRAM: Access Code: [email or web address] URL: [email or web address] Date: 11/12/2023 Prepared by: Reyes Kohut  Exercises - Long Sitting Plantar Fascia Stretch with Towel  - 2-3 x daily - 5 x weekly - 1 sets - 2 reps - 30s hold - Ankle Inversion Eversion Towel Slide  - 2-3 x daily - 5 x weekly - 2 sets - 15 reps - Single Leg Stance with Support  - 2-3 x daily - 5 x weekly - 2 sets - 2 reps - 15s hold - Standing Heel Raise with Support  - 2-3 x daily - 5 x weekly - 1 sets - 10 reps - Toe Raise With Back Against Wall  - 2-3 x daily - 5 x weekly - 1 sets - 10 reps  ASSESSMENT:  CLINICAL IMPRESSION: Symptoms persist.  Remains TTP over R cuboid.  Advanced to more challenging tasks for balance and proprioception.  Able to tolerate more aggressive tasks w/o symptom flare-up.    Patient is a 78 y.o. female who was seen today for physical therapy evaluation and treatment for R plantar fascia tear.  Patient presents with mild ROM and strength deficits in R ankle as well as TTP at origin of plantar fascia.  Pain limits prolonged standing and  WB.     OBJECTIVE IMPAIRMENTS: Abnormal gait, decreased activity tolerance, decreased balance, decreased knowledge of condition, decreased mobility, difficulty walking, decreased strength, increased fascial restrictions, improper body mechanics, and pain.   ACTIVITY LIMITATIONS: standing, squatting, and stairs  PERSONAL FACTORS: Age, Fitness, Past/current experiences, and Time since onset of injury/illness/exacerbation are also affecting patient's functional outcome.   REHAB POTENTIAL: Good  CLINICAL DECISION MAKING: Evolving/moderate complexity  EVALUATION COMPLEXITY: Low   GOALS: Goals reviewed with patient? No  SHORT TERM GOALS: Target date: 10/26/2023   Patient to demonstrate independence in HEP  Baseline: [email or web address] Goal status: INITIAL  2.  263ft ambulation w/o AD Baseline: 22ft w/o AD Goal status: INITIAL  LONG TERM GOALS: Target date: 11/16/2023    Patient will acknowledge 2/10 pain at least once during episode of care  Baseline: 5/10 Goal status: INITIAL  2.  Patient will score at least 40/80 on LEFS to signify clinically meaningful improvement in functional abilities.   Baseline: 21/80 Goal status: INITIAL  3.  Patient will increase 30s chair stand reps from 7 to 10 without arms to demonstrate and improved functional ability with less pain/difficulty as well as reduce fall risk. Baseline: 7; 10/21/23 8 Goal status: INITIAL  4.  Increase R ankle strength to 4/5 Baseline:  MMT Right eval Left eval  Hip flexion    Hip extension    Hip abduction    Hip adduction    Hip internal rotation    Hip external rotation  Knee flexion    Knee extension    Ankle dorsiflexion 4-   Ankle plantarflexion 3   Ankle inversion 4-   Ankle eversion 4-    Goal status: INITIAL  5.  Increase AROM DF to 8d Baseline:  Active ROM Right eval Left eval  Hip flexion    Hip extension    Hip abduction    Hip adduction    Hip internal rotation    Hip external rotation     Knee flexion    Knee extension    Ankle dorsiflexion 5d 5d  Ankle plantarflexion    Ankle inversion 20d 30d  Ankle eversion 15d 20d   Goal status: INITIAL     PLAN:  PT FREQUENCY: 1-2x/week  PT DURATION: 6 weeks  PLANNED INTERVENTIONS: 97110-Therapeutic exercises, 97530- Therapeutic activity, 97112- Neuromuscular re-education, 97535- Self Care, [id number]- Manual therapy, [phone]- Gait training, Balance training, Stair training, and Joint mobilization  PLAN FOR NEXT SESSION: HEP review and update, manual techniques as appropriate, aerobic tasks, ROM and flexibility activities, strengthening and PREs, TPDN, gait and balance training as needed   Jeff Halla Chopp PT  12/09/2023, 1:26 PM

## 2023-12-09 ENCOUNTER — Ambulatory Visit: Attending: Podiatry

## 2023-12-09 DIAGNOSIS — M6281 Muscle weakness (generalized): Secondary | ICD-10-CM | POA: Diagnosis not present

## 2023-12-09 DIAGNOSIS — R2681 Unsteadiness on feet: Secondary | ICD-10-CM | POA: Diagnosis not present

## 2023-12-09 DIAGNOSIS — M722 Plantar fascial fibromatosis: Secondary | ICD-10-CM | POA: Insufficient documentation

## 2023-12-10 DIAGNOSIS — L821 Other seborrheic keratosis: Secondary | ICD-10-CM | POA: Diagnosis not present

## 2023-12-10 DIAGNOSIS — L570 Actinic keratosis: Secondary | ICD-10-CM | POA: Diagnosis not present

## 2023-12-10 DIAGNOSIS — D225 Melanocytic nevi of trunk: Secondary | ICD-10-CM | POA: Diagnosis not present

## 2023-12-10 DIAGNOSIS — Z85828 Personal history of other malignant neoplasm of skin: Secondary | ICD-10-CM | POA: Diagnosis not present

## 2023-12-10 DIAGNOSIS — D171 Benign lipomatous neoplasm of skin and subcutaneous tissue of trunk: Secondary | ICD-10-CM | POA: Diagnosis not present

## 2023-12-10 DIAGNOSIS — L578 Other skin changes due to chronic exposure to nonionizing radiation: Secondary | ICD-10-CM | POA: Diagnosis not present

## 2023-12-10 DIAGNOSIS — Z86018 Personal history of other benign neoplasm: Secondary | ICD-10-CM | POA: Diagnosis not present

## 2023-12-10 DIAGNOSIS — L853 Xerosis cutis: Secondary | ICD-10-CM | POA: Diagnosis not present

## 2023-12-10 DIAGNOSIS — L814 Other melanin hyperpigmentation: Secondary | ICD-10-CM | POA: Diagnosis not present

## 2023-12-11 ENCOUNTER — Ambulatory Visit (INDEPENDENT_AMBULATORY_CARE_PROVIDER_SITE_OTHER)

## 2023-12-11 DIAGNOSIS — M722 Plantar fascial fibromatosis: Secondary | ICD-10-CM

## 2023-12-11 NOTE — Progress Notes (Signed)
  Patient presents for the 2nd EPAT treatment today with complaint of right heel and lateral pain. Diagnosed with plantar fascitis by Dr. Gershon. This has been ongoing for several months. The patient has tried ice, stretching, NSAIDS and supportive shoe gear with no long term relief.    Most of the pain is located in the right heel and lateral side of foot. Patient states minimum improvement with pain post 1st treatment.   ESWT administered and tolerated well.Treatment settings initiated at:               Energy:           60   Ended treatment session today with 2500 shocks at the following settings:               Energy:           60             Frequency:     10 hz     Reviewed post EPAT instructions. Advised to avoid ice and NSAIDs throughout the treatment process and to utilize boot or supportive shoes for at least the next 3 days.   Follow up for 3rd treatment in 1 week.

## 2023-12-14 NOTE — Therapy (Unsigned)
 OUTPATIENT PHYSICAL THERAPY TREATMENT NOTE/PROGRESS NOTE   Patient Name: Lindsay Peck MRN: [id number] DOB:1945-08-18, 78 y.o., female Today's Date: 12/16/2023  END OF SESSION:  PT End of Session - 12/16/23 1214     Visit Number 10    Number of Visits 12    Date for PT Re-Evaluation 12/05/23    Authorization Type [employer] MCR    PT Start Time 1215    PT Stop Time 1300    PT Time Calculation (min) 45 min    Activity Tolerance Patient tolerated treatment well;Patient limited by pain    Behavior During Therapy WFL for tasks assessed/performed         Past Medical History:  Diagnosis Date   Arthritis    In hand   Cancer (HCC)    Precancerous on hand (Bilaterally)   Hypertension    Right arm fracture    Urinary retention    Past Surgical History:  Procedure Laterality Date   EYE SURGERY Bilateral    Cataract   PUBOVAGINAL SLING N/A 07/15/2019   Procedure: CARLOYN GLADE;  Surgeon: Cam Morene ORN, MD;  Location: [facility];  Service: Urology;  Laterality: N/A;   ROBOTIC ASSISTED LAPAROSCOPIC SACROCOLPOPEXY N/A 07/15/2019   Procedure: XI ROBOTIC ASSISTED LAPAROSCOPIC SACROCOLPOPEXY,SUPRACERVICAL HYSTERECTOMY;  Surgeon: Cam Morene ORN, MD;  Location: [facility];  Service: Urology;  Laterality: N/A;   TUBAL LIGATION     Patient Active Problem List   Diagnosis Date Noted   Dizziness 11/15/2021   Ear pressure, right 11/15/2021   Temporomandibular jaw dysfunction 11/15/2021   Cystocele with prolapse 07/15/2019   Status post arthroscopy of right knee 10/22/2016   Acute pain of right knee 08/25/2016   Complex tear of medial meniscus of right knee as current injury 08/25/2016    PCP: Cleotilde Planas, MD   REFERRING PROVIDER: Gershon Donnice SAUNDERS, DPM  REFERRING DIAG: M62.9 (ICD-10-CM) - Nontraumatic tear of plantar fascia  THERAPY DIAG:  Plantar fasciitis of right foot  Unsteadiness on feet  Muscle weakness (generalized)  Rationale for Evaluation and Treatment:  Rehabilitation  ONSET DATE: 08/26/23  SUBJECTIVE:   SUBJECTIVE STATEMENT: Feeling better, pain levels 2-3/10.  Feels ECSWT session relieved pain somewhat.    PERTINENT HISTORY: Right plantar fascial tear -Ice, discussed gradual transition back to regular shoe as tolerated.  Discussed icing, stretching exercises.  Referral to physical therapy [facility].  If her symptoms continue or worsen recommend to go back into the walking boot.   Return in about 6 weeks (around 11/06/2023) for plantar fascia tear, follow up from physical therapy . PAIN:  Are you having pain? Yes: NPRS scale: 5/10 Pain location: R arch Pain description: ache Aggravating factors: weight bearing Relieving factors: rest  PRECAUTIONS: None  RED FLAGS: None   WEIGHT BEARING RESTRICTIONS: No  FALLS:  Has patient fallen in last 6 months? No  OCCUPATION: retired  PLOF: Independent  PATIENT GOALS: To manage my R foot pain  NEXT MD VISIT: 6 weeks  OBJECTIVE:  Note: Objective measures were completed at Evaluation unless otherwise noted.  DIAGNOSTIC FINDINGS: none available 11/12/23 IMPRESSION: Mild edema in the cuboid which may be related to degenerative edema or a stress reaction. There is no displaced fracture identified. Correlation for lateral midfoot/hindfoot pain.   Mild edema in the plantar aspect of the calcaneus with thickening of the medial lateral fascia consistent with plantar fasciitis.   Electronically signed by: Norleen Satchel MD 11/06/2023 11:36 AM EDT RP Workstation: [id number]  PATIENT  SURVEYS:  LEFS 21/80; 12/16/23 41/80   MUSCLE LENGTH: Not tested  POSTURE: No Significant postural limitations  PALPATION: TTP plantar fascia origin  LOWER EXTREMITY ROM:  Active ROM Right eval Left eval R  11/12/23  Hip flexion     Hip extension     Hip abduction     Hip adduction     Hip internal rotation     Hip external rotation     Knee flexion     Knee  extension     Ankle dorsiflexion 5d 5d 8/12d  Ankle plantarflexion     Ankle inversion 20d 30d   Ankle eversion 15d 20d    (Blank rows = not tested)  LOWER EXTREMITY MMT:  MMT Right eval Left eval  Hip flexion    Hip extension    Hip abduction    Hip adduction    Hip internal rotation    Hip external rotation    Knee flexion    Knee extension    Ankle dorsiflexion 4-   Ankle plantarflexion 3   Ankle inversion 4-   Ankle eversion 4-    (Blank rows = not tested)  LOWER EXTREMITY SPECIAL TESTS:  deferred  FUNCTIONAL TESTS:  30 seconds chair stand test 7 reps; 10/21/23 8 reps 10/21/23 SLS 13s L, 8s R 11/12/23 SLS 15s L, 13s R; 12/16/23 20s B  12/16/23 2 MWT 410 ft GAIT: Distance walked: 99ft x2 Assistive device utilized: None Level of assistance: Complete Independence Comments: antalgic with decreased step length L                                                                                                                                TREATMENT: [facility] Adult PT Treatment:                                                DATE: 12/16/23 Therapeutic Exercise: Nustep L6 8 min 50 SPM Neuromuscular re-ed: Heel raises from 4 in step Runners step fro 8 in step 10/10 Side stepping against red band 2 trips  Therapeutic Activity: LEFS, 2 MWT, assessment of goal progress  [facility] Adult PT Treatment:                                                DATE: 12/09/23 Therapeutic Exercise: Nustep L6 8 min 45 SPM Neuromuscular re-ed: R soleus strengthening over 4 in step 20# DB 15x2 Side stepping against yellow band 2 trips  Therapeutic Activity: PF Stretch with towel 60s x2 4 way ankle GTB 15x2  [facility] Adult PT Treatment:  DATE: 11/26/23 Therapeutic Exercise: Nustep L6 8 min 40 SPM Neuromuscular re-ed: R soleus strengthening over 4 in step 15# DB 15x2 10x STS from airex pad 10# DB Runners step 4 in 10/10 5# KB Therapeutic Activity: 4  way ankle RTB 15x2 Static stance on 4 in step with P-ball circles 30x ea  [facility] Adult PT Treatment:                                                DATE: 11/17/23 Therapeutic Exercise: Nustep L5 8 min 40 SPM Neuromuscular re-ed: R soleus strengthening over 4 in step 10# DB 15x2 10x STS from airex pad 10# DB Runners step 4 in 10/10 Therapeutic Activity: Slant board stretch gastroc/soleus 30s x2 ea. 4 way ankle RTB 15x Static stance on airex EC 30s Tandem stance on airex 30s each position  [facility] Adult PT Treatment:                                                DATE: 11/12/23 Therapeutic Exercise: RB A/P static 60s RB A/P dynamic 60s Step ups onto RB with R lifting/lowering Neuromuscular re-ed: 10x STS from airex pad Heel raises against wall 15x Toe raises against wall 15x Therapeutic Activity: Re-assessment of ROM, mobility and function  [facility] Adult PT Treatment:                                                DATE: 10/21/23 Therapeutic Activity: Re-assessment of ROM, strength, balance and palpation with point tenderness elicited at origin of plantar fascia.  Unable to perform SL heel raise R due to pain/weakness.  Self Care: Discussion of POC regarding ongoing symptoms, lack of imaging to date and pending MRI.  Agree to place PT on hold until imaging obtained.  [facility] Adult PT Treatment:                                                DATE: 10/19/23 Therapeutic Exercise: Nustep L4 8 min Neuromuscular re-ed: Runners step from 4 in 10/10 Talocrural and subtalar ankle mobs to promote mobility and ROM 5x10 Therapeutic Activity: Gastroc/soleus stretch 30s x2 ea. Heel raises from 4 in step 10x2 4 way ankle YTB 15x  [facility] Adult PT Treatment:                                                DATE:10/12/2023 Therapeutic Activity: NuStep 8' for activity tolerance Slant board gastroc stretch 2x1' Slant board on 6 box soleus stretch 2x1' Split stance great toe isolation 2x15 Standing heel  raise 2x12, hold 2s  [facility] Adult PT Treatment:  DATE: 10/09/23 Therapeutic Exercise: Nustep L2 8 min Manual Therapy: Talocrural and subtalar ankle mobs to promote mobility and ROM 5x10 Therapeutic Activity: PF Stretch 60s Seated INV/EV over towel 60s 4 way ankle YTB 15x2 Runners step 4 in 10/10  [facility] Adult PT Treatment:                                                DATE: 10/05/23 Eval and HEP Self Care: Additional minutes spent for educating on updated Therapeutic Home Exercise Program as well as comparing current status to condition at start of symptoms. This included exercises focusing on stretching, strengthening, with focus on eccentric aspects. Long term goals include an improvement in range of motion, strength, endurance as well as avoiding reinjury. Patient's frequency would include in 1-2 times a day, 3-5 times a week for a duration of 6-12 weeks. Proper technique shown and discussed handout in great detail. All questions were discussed and addressed.      PATIENT EDUCATION:  Education details: Discussed eval findings, rehab rationale and POC and patient is in agreement  Person educated: Patient Education method: Explanation and Handouts Education comprehension: verbalized understanding and needs further education  HOME EXERCISE PROGRAM: Access Code: [email or web address] URL: [email or web address] Date: 11/12/2023 Prepared by: Reyes Kohut  Exercises - Long Sitting Plantar Fascia Stretch with Towel  - 2-3 x daily - 5 x weekly - 1 sets - 2 reps - 30s hold - Ankle Inversion Eversion Towel Slide  - 2-3 x daily - 5 x weekly - 2 sets - 15 reps - Single Leg Stance with Support  - 2-3 x daily - 5 x weekly - 2 sets - 2 reps - 15s hold - Standing Heel Raise with Support  - 2-3 x daily - 5 x weekly - 1 sets - 10 reps - Toe Raise With Back Against Wall  - 2-3 x daily - 5 x weekly - 1 sets - 10 reps  ASSESSMENT:  CLINICAL IMPRESSION:  Pain levels have decreased, LEFS score has improved, 2 MWT distance increased and SLS times now equal B.  Patient able to tolerate more challenging tasks and progress made towards goals.    Patient is a 78 y.o. female who was seen today for physical therapy evaluation and treatment for R plantar fascia tear.  Patient presents with mild ROM and strength deficits in R ankle as well as TTP at origin of plantar fascia.  Pain limits prolonged standing and WB.     OBJECTIVE IMPAIRMENTS: Abnormal gait, decreased activity tolerance, decreased balance, decreased knowledge of condition, decreased mobility, difficulty walking, decreased strength, increased fascial restrictions, improper body mechanics, and pain.   ACTIVITY LIMITATIONS: standing, squatting, and stairs  PERSONAL FACTORS: Age, Fitness, Past/current experiences, and Time since onset of injury/illness/exacerbation are also affecting patient's functional outcome.   REHAB POTENTIAL: Good  CLINICAL DECISION MAKING: Evolving/moderate complexity  EVALUATION COMPLEXITY: Low   GOALS: Goals reviewed with patient? No  SHORT TERM GOALS: Target date: 10/26/2023   Patient to demonstrate independence in HEP  Baseline: [email or web address] Goal status: Met  2.  217ft ambulation w/o AD Baseline: 12ft w/o AD; 12/16/23 2 MWT 420 ft Goal status: Met  LONG TERM GOALS: Target date: 11/16/2023    Patient will acknowledge 2/10 pain at least once during episode of care  Baseline: 5/10; 12/16/23 2-3/10 Goal status: Ongoing  2.  Patient  will score at least 40/80 on LEFS to signify clinically meaningful improvement in functional abilities.   Baseline: 21/80; 12/16/23 41/80  Goal status: Met  3.  Patient will increase 30s chair stand reps from 7 to 10 without arms to demonstrate and improved functional ability with less pain/difficulty as well as reduce fall risk. Baseline: 7; 10/21/23 8; 12/16/23 9 Goal status: Ongoing  4.  Increase R ankle strength to  4/5 Baseline:  MMT Right eval Left eval  Hip flexion    Hip extension    Hip abduction    Hip adduction    Hip internal rotation    Hip external rotation    Knee flexion    Knee extension    Ankle dorsiflexion 4-   Ankle plantarflexion 3   Ankle inversion 4-   Ankle eversion 4-    Goal status: Ongoing pending pain levels  5.  Increase AROM DF to 8d Baseline:  Active ROM Right eval Left eval R  11/12/23  Hip flexion     Hip extension     Hip abduction     Hip adduction     Hip internal rotation     Hip external rotation     Knee flexion     Knee extension     Ankle dorsiflexion 5d 5d 8/12d  Ankle plantarflexion     Ankle inversion 20d 30d   Ankle eversion 15d 20d    Goal status: Met     PLAN:  PT FREQUENCY: 1-2x/week  PT DURATION: 6 weeks  PLANNED INTERVENTIONS: 97110-Therapeutic exercises, 97530- Therapeutic activity, 97112- Neuromuscular re-education, 97535- Self Care, [id number]- Manual therapy, [phone]- Gait training, Balance training, Stair training, and Joint mobilization  PLAN FOR NEXT SESSION: HEP review and update, manual techniques as appropriate, aerobic tasks, ROM and flexibility activities, strengthening and PREs, TPDN, gait and balance training as needed   Jeff Jessi Jessop PT  12/16/2023, 1:17 PM

## 2023-12-15 DIAGNOSIS — I10 Essential (primary) hypertension: Secondary | ICD-10-CM | POA: Diagnosis not present

## 2023-12-15 DIAGNOSIS — E7800 Pure hypercholesterolemia, unspecified: Secondary | ICD-10-CM | POA: Diagnosis not present

## 2023-12-16 ENCOUNTER — Ambulatory Visit

## 2023-12-16 DIAGNOSIS — R2681 Unsteadiness on feet: Secondary | ICD-10-CM | POA: Diagnosis not present

## 2023-12-16 DIAGNOSIS — M722 Plantar fascial fibromatosis: Secondary | ICD-10-CM | POA: Diagnosis not present

## 2023-12-16 DIAGNOSIS — M6281 Muscle weakness (generalized): Secondary | ICD-10-CM | POA: Diagnosis not present

## 2023-12-16 NOTE — Addendum Note (Signed)
 Addended by: Marshawn Ninneman M on: 12/16/2023 12:22 PM   Modules accepted: Orders

## 2023-12-22 ENCOUNTER — Ambulatory Visit (INDEPENDENT_AMBULATORY_CARE_PROVIDER_SITE_OTHER)

## 2023-12-22 DIAGNOSIS — M722 Plantar fascial fibromatosis: Secondary | ICD-10-CM

## 2023-12-22 NOTE — Progress Notes (Signed)
 Patient presents for the 3rd EPAT treatment today with complaint of right heel pain and right sided lateral pain. Diagnosed with plantar fascitis by Dr. Gershon. This has been ongoing for several months. The patient has tried ice, stretching, NSAIDS and supportive shoe gear with no long term relief.   Most of the pain is located in the right heel and on the lateral side of foot. Patient reports some improvement after last treatment and is wearing a PF brace on right foot.   ESWT administered and tolerated well.Treatment settings initiated at:   Energy: 60  Ended treatment session today with 2500 shocks at the following settings:   Energy: 60  Frequency: 10 hz Some tenderness with application of pressure and treatment so no increase in energy level today   Reviewed post EPAT instructions. Advised to avoid ice and NSAIDs throughout the treatment process and to utilize boot or supportive shoes for at least the next 3 days.  Follow up for 4th treatment in 2 week.

## 2023-12-23 NOTE — Therapy (Unsigned)
 OUTPATIENT PHYSICAL THERAPY TREATMENT NOTE/PROGRESS NOTE   Patient Name: MAELEY MATTON MRN: [id number] DOB:12-01-1945, 78 y.o., female Today's Date: 12/23/2023  END OF SESSION:   Past Medical History:  Diagnosis Date   Arthritis    In hand   Cancer (HCC)    Precancerous on hand (Bilaterally)   Hypertension    Right arm fracture    Urinary retention    Past Surgical History:  Procedure Laterality Date   EYE SURGERY Bilateral    Cataract   PUBOVAGINAL SLING N/A 07/15/2019   Procedure: CARLOYN GLADE;  Surgeon: Cam Morene ORN, MD;  Location: [facility];  Service: Urology;  Laterality: N/A;   ROBOTIC ASSISTED LAPAROSCOPIC SACROCOLPOPEXY N/A 07/15/2019   Procedure: XI ROBOTIC ASSISTED LAPAROSCOPIC SACROCOLPOPEXY,SUPRACERVICAL HYSTERECTOMY;  Surgeon: Cam Morene ORN, MD;  Location: [facility];  Service: Urology;  Laterality: N/A;   TUBAL LIGATION     Patient Active Problem List   Diagnosis Date Noted   Dizziness 11/15/2021   Ear pressure, right 11/15/2021   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  Rationale for Evaluation and Treatment: Rehabilitation  ONSET DATE: 08/26/23  SUBJECTIVE:   SUBJECTIVE STATEMENT: Feeling better, pain levels 2-3/10.  Feels ECSWT session relieved pain somewhat.    PERTINENT HISTORY: Right plantar fascial tear -Ice, discussed gradual transition back to regular shoe as tolerated.  Discussed icing, stretching exercises.  [facility]erral to [facility] therapy [facility].  If her symptoms continue or worsen recommend to go back into the  walking boot.   Return in about 6 weeks (around 11/06/2023) for plantar fascia tear, follow up from physical therapy . PAIN:  Are you having pain? Yes: NPRS scale: 5/10 Pain location: R arch Pain description: ache Aggravating factors: weight bearing Relieving factors: rest  PRECAUTIONS: None  RED FLAGS: None   WEIGHT BEARING RESTRICTIONS: No  FALLS:  Has patient fallen in last 6 months? No  OCCUPATION: retired  PLOF: Independent  PATIENT GOALS: To manage my R foot pain  NEXT MD VISIT: 6 weeks  OBJECTIVE:  Note: Objective measures were completed at Evaluation unless otherwise noted.  DIAGNOSTIC FINDINGS: none available 11/12/23 IMPRESSION: Mild edema in the cuboid which may be related to degenerative edema or a stress reaction. There is no displaced fracture identified. Correlation for lateral midfoot/hindfoot pain.   Mild edema in the plantar aspect of the calcaneus with thickening of the medial lateral fascia consistent with plantar fasciitis.   Electronically signed by: Norleen Satchel MD 11/06/2023 11:36 AM EDT RP Workstation: [id number]  PATIENT SURVEYS:  LEFS 21/80; 12/16/23 41/80   MUSCLE LENGTH: Not tested  POSTURE: No Significant postural limitations  PALPATION: TTP plantar fascia origin  LOWER EXTREMITY ROM:  Active ROM Right eval Left eval R  11/12/23  Hip flexion     Hip extension     Hip abduction     Hip adduction     Hip internal rotation     Hip external rotation     Knee flexion     Knee extension     Ankle dorsiflexion 5d 5d 8/12d  Ankle plantarflexion  Ankle inversion 20d 30d   Ankle eversion 15d 20d    (Blank rows = not tested)  LOWER EXTREMITY MMT:  MMT Right eval Left eval  Hip flexion    Hip extension    Hip abduction    Hip adduction    Hip internal rotation    Hip external rotation    Knee flexion    Knee extension    Ankle dorsiflexion 4-   Ankle plantarflexion 3   Ankle inversion 4-   Ankle eversion 4-     (Blank rows = not tested)  LOWER EXTREMITY SPECIAL TESTS:  deferred  FUNCTIONAL TESTS:  30 seconds chair stand test 7 reps; 10/21/23 8 reps 10/21/23 SLS 13s L, 8s R 11/12/23 SLS 15s L, 13s R; 12/16/23 20s B  12/16/23 2 MWT 410 ft GAIT: Distance walked: 4ft x2 Assistive device utilized: None Level of assistance: Complete Independence Comments: antalgic with decreased step length L                                                                                                                                TREATMENT: [facility] Adult PT Treatment:                                                DATE: 12/16/23 Therapeutic Exercise: Nustep L6 8 min 50 SPM Neuromuscular re-ed: Heel raises from 4 in step Runners step fro 8 in step 10/10 Side stepping against red band 2 trips  Therapeutic Activity: LEFS, 2 MWT, assessment of goal progress  [facility] Adult PT Treatment:                                                DATE: 12/09/23 Therapeutic Exercise: Nustep L6 8 min 45 SPM Neuromuscular re-ed: R soleus strengthening over 4 in step 20# DB 15x2 Side stepping against yellow band 2 trips  Therapeutic Activity: PF Stretch with towel 60s x2 4 way ankle GTB 15x2  [facility] Adult PT Treatment:                                                DATE: 11/26/23 Therapeutic Exercise: Nustep L6 8 min 40 SPM Neuromuscular re-ed: R soleus strengthening over 4 in step 15# DB 15x2 10x STS from airex pad 10# DB Runners step 4 in 10/10 5# KB Therapeutic Activity: 4 way ankle RTB 15x2 Static stance on 4 in step with P-ball circles 30x ea  [facility] Adult PT Treatment:  DATE: 11/17/23 Therapeutic Exercise: Nustep L5 8 min 40 SPM Neuromuscular re-ed: R soleus strengthening over 4 in step 10# DB 15x2 10x STS from airex pad 10# DB Runners step 4 in 10/10 Therapeutic Activity: Slant board stretch gastroc/soleus 30s x2 ea. 4 way ankle RTB 15x Static stance on airex EC  30s Tandem stance on airex 30s each position  [facility] Adult PT Treatment:                                                DATE: 11/12/23 Therapeutic Exercise: RB A/P static 60s RB A/P dynamic 60s Step ups onto RB with R lifting/lowering Neuromuscular re-ed: 10x STS from airex pad Heel raises against wall 15x Toe raises against wall 15x Therapeutic Activity: Re-assessment of ROM, mobility and function  [facility] Adult PT Treatment:                                                DATE: 10/21/23 Therapeutic Activity: Re-assessment of ROM, strength, balance and palpation with point tenderness elicited at origin of plantar fascia.  Unable to perform SL heel raise R due to pain/weakness.  Self Care: Discussion of POC regarding ongoing symptoms, lack of imaging to date and pending MRI.  Agree to place PT on hold until imaging obtained.  [facility] Adult PT Treatment:                                                DATE: 10/19/23 Therapeutic Exercise: Nustep L4 8 min Neuromuscular re-ed: Runners step from 4 in 10/10 Talocrural and subtalar ankle mobs to promote mobility and ROM 5x10 Therapeutic Activity: Gastroc/soleus stretch 30s x2 ea. Heel raises from 4 in step 10x2 4 way ankle YTB 15x  [facility] Adult PT Treatment:                                                DATE:10/12/2023 Therapeutic Activity: NuStep 8' for activity tolerance Slant board gastroc stretch 2x1' Slant board on 6 box soleus stretch 2x1' Split stance great toe isolation 2x15 Standing heel raise 2x12, hold 2s  [facility] Adult PT Treatment:                                                DATE: 10/09/23 Therapeutic Exercise: Nustep L2 8 min Manual Therapy: Talocrural and subtalar ankle mobs to promote mobility and ROM 5x10 Therapeutic Activity: PF Stretch 60s Seated INV/EV over towel 60s 4 way ankle YTB 15x2 Runners step 4 in 10/10  [facility] Adult PT Treatment:                                                DATE: 10/05/23 Eval and  HEP Self Care: Additional minutes spent for educating on updated Therapeutic Home Exercise Program as well as comparing current status to condition at start of symptoms. This included exercises focusing on stretching, strengthening, with focus on eccentric aspects. Long term goals include an improvement in range of motion, strength, endurance as well as avoiding reinjury. Patient's frequency would include in 1-2 times a day, 3-5 times a week for a duration of 6-12 weeks. Proper technique shown and discussed handout in great detail. All questions were discussed and addressed.      PATIENT EDUCATION:  Education details: Discussed eval findings, rehab rationale and POC and patient is in agreement  Person educated: Patient Education method: Explanation and Handouts Education comprehension: verbalized understanding and needs further education  HOME EXERCISE PROGRAM: Access Code: [email or web address] URL: [email or web address] Date: 11/12/2023 Prepared by: Reyes Kohut  Exercises - Long Sitting Plantar Fascia Stretch with Towel  - 2-3 x daily - 5 x weekly - 1 sets - 2 reps - 30s hold - Ankle Inversion Eversion Towel Slide  - 2-3 x daily - 5 x weekly - 2 sets - 15 reps - Single Leg Stance with Support  - 2-3 x daily - 5 x weekly - 2 sets - 2 reps - 15s hold - Standing Heel Raise with Support  - 2-3 x daily - 5 x weekly - 1 sets - 10 reps - Toe Raise With Back Against Wall  - 2-3 x daily - 5 x weekly - 1 sets - 10 reps  ASSESSMENT:  CLINICAL IMPRESSION: Pain levels have decreased, LEFS score has improved, 2 MWT distance increased and SLS times now equal B.  Patient able to tolerate more challenging tasks and progress made towards goals.    Patient is a 78 y.o. female who was seen today for physical therapy evaluation and treatment for R plantar fascia tear.  Patient presents with mild ROM and strength deficits in R ankle as well as TTP at origin of plantar fascia.  Pain limits prolonged  standing and WB.     OBJECTIVE IMPAIRMENTS: Abnormal gait, decreased activity tolerance, decreased balance, decreased knowledge of condition, decreased mobility, difficulty walking, decreased strength, increased fascial restrictions, improper body mechanics, and pain.   ACTIVITY LIMITATIONS: standing, squatting, and stairs  PERSONAL FACTORS: Age, Fitness, Past/current experiences, and Time since onset of injury/illness/exacerbation are also affecting patient's functional outcome.   REHAB POTENTIAL: Good  CLINICAL DECISION MAKING: Evolving/moderate complexity  EVALUATION COMPLEXITY: Low   GOALS: Goals reviewed with patient? No  SHORT TERM GOALS: Target date: 10/26/2023   Patient to demonstrate independence in HEP  Baseline: [email or web address] Goal status: Met  2.  242ft ambulation w/o AD Baseline: 37ft w/o AD; 12/16/23 2 MWT 420 ft Goal status: Met  LONG TERM GOALS: Target date: 11/16/2023    Patient will acknowledge 2/10 pain at least once during episode of care  Baseline: 5/10; 12/16/23 2-3/10 Goal status: Ongoing  2.  Patient will score at least 40/80 on LEFS to signify clinically meaningful improvement in functional abilities.   Baseline: 21/80; 12/16/23 41/80  Goal status: Met  3.  Patient will increase 30s chair stand reps from 7 to 10 without arms to demonstrate and improved functional ability with less pain/difficulty as well as reduce fall risk. Baseline: 7; 10/21/23 8; 12/16/23 9 Goal status: Ongoing  4.  Increase R ankle strength to 4/5 Baseline:  MMT Right eval Left eval  Hip flexion    Hip extension    Hip abduction  Hip adduction    Hip internal rotation    Hip external rotation    Knee flexion    Knee extension    Ankle dorsiflexion 4-   Ankle plantarflexion 3   Ankle inversion 4-   Ankle eversion 4-    Goal status: Ongoing pending pain levels  5.  Increase AROM DF to 8d Baseline:  Active ROM Right eval Left eval R  11/12/23  Hip flexion     Hip  extension     Hip abduction     Hip adduction     Hip internal rotation     Hip external rotation     Knee flexion     Knee extension     Ankle dorsiflexion 5d 5d 8/12d  Ankle plantarflexion     Ankle inversion 20d 30d   Ankle eversion 15d 20d    Goal status: Met     PLAN:  PT FREQUENCY: 1-2x/week  PT DURATION: 6 weeks  PLANNED INTERVENTIONS: 97110-Therapeutic exercises, 97530- Therapeutic activity, 97112- Neuromuscular re-education, 97535- Self Care, [id number]- Manual therapy, [phone]- Gait training, Balance training, Stair training, and Joint mobilization  PLAN FOR NEXT SESSION: HEP review and update, manual techniques as appropriate, aerobic tasks, ROM and flexibility activities, strengthening and PREs, TPDN, gait and balance training as needed   Jeff Michelle Vanhise PT  12/23/2023, 2:27 PM

## 2023-12-25 ENCOUNTER — Ambulatory Visit

## 2023-12-25 DIAGNOSIS — M6281 Muscle weakness (generalized): Secondary | ICD-10-CM | POA: Diagnosis not present

## 2023-12-25 DIAGNOSIS — R2681 Unsteadiness on feet: Secondary | ICD-10-CM

## 2023-12-25 DIAGNOSIS — M722 Plantar fascial fibromatosis: Secondary | ICD-10-CM | POA: Diagnosis not present

## 2024-01-01 ENCOUNTER — Ambulatory Visit: Payer: Self-pay | Attending: Podiatry

## 2024-01-01 ENCOUNTER — Ambulatory Visit: Admitting: Podiatry

## 2024-01-01 ENCOUNTER — Encounter: Payer: Self-pay | Admitting: Podiatry

## 2024-01-01 DIAGNOSIS — M722 Plantar fascial fibromatosis: Secondary | ICD-10-CM

## 2024-01-01 DIAGNOSIS — T148XXA Other injury of unspecified body region, initial encounter: Secondary | ICD-10-CM | POA: Diagnosis not present

## 2024-01-01 DIAGNOSIS — R2681 Unsteadiness on feet: Secondary | ICD-10-CM | POA: Diagnosis not present

## 2024-01-01 DIAGNOSIS — M6281 Muscle weakness (generalized): Secondary | ICD-10-CM | POA: Insufficient documentation

## 2024-01-01 NOTE — Therapy (Addendum)
 OUTPATIENT PHYSICAL THERAPY TREATMENT NOTE/DC SUMMARY   Patient Name: DESTINIE THORNSBERRY MRN: [id number] DOB:[birth year], 78 y.o., female Today's Date: 01/01/2024  PHYSICAL THERAPY DISCHARGE SUMMARY  Visits from Start of Care: 12  Current functional level related to goals / functional outcomes: Maximum benefit reached   Remaining deficits: pain   Education / Equipment: HEP   Patient agrees to discharge. Patient goals were partially met. Patient is being discharged due to maximized rehab potential.   END OF SESSION:  PT End of Session - 01/01/24 1004     Visit Number 12    Number of Visits 12    Date for PT Re-Evaluation 01/05/24    Authorization Type [employer] MCR    PT Start Time 1000    PT Stop Time 1045    PT Time Calculation (min) 45 min    Activity Tolerance Patient tolerated treatment well;Patient limited by pain    Behavior During Therapy WFL for tasks assessed/performed           Past Medical History:  Diagnosis Date   Arthritis    In hand   Cancer (HCC)    Precancerous on hand (Bilaterally)   Hypertension    Right arm fracture    Urinary retention    Past Surgical History:  Procedure Laterality Date   EYE SURGERY Bilateral    Cataract   PUBOVAGINAL SLING N/A 07/15/2019   Procedure: CARLOYN GLADE;  Surgeon: Cam Morene ORN, MD;  Location: [facility];  Service: Urology;  Laterality: N/A;   ROBOTIC ASSISTED LAPAROSCOPIC SACROCOLPOPEXY N/A 07/15/2019   Procedure: XI ROBOTIC ASSISTED LAPAROSCOPIC SACROCOLPOPEXY,SUPRACERVICAL HYSTERECTOMY;  Surgeon: Cam Morene ORN, MD;  Location: [facility];  Service: Urology;  Laterality: N/A;   TUBAL LIGATION     Patient Active Problem List   Diagnosis Date Noted   Dizziness 11/15/2021   Ear pressure, right 11/15/2021   Temporomandibular jaw dysfunction 11/15/2021   Cystocele with prolapse 07/15/2019   Status post arthroscopy of right knee 10/22/2016   Acute pain of right knee 08/25/2016   Complex tear of medial  meniscus of right knee as current injury 08/25/2016    PCP: Cleotilde Planas, MD   REFERRING PROVIDER: Gershon Donnice SAUNDERS, DPM  REFERRING DIAG: M62.9 (ICD-10-CM) - Nontraumatic tear of plantar fascia  THERAPY DIAG:  Plantar fasciitis of right foot  Unsteadiness on feet  Muscle weakness (generalized)  Rationale for Evaluation and Treatment: Rehabilitation  ONSET DATE: 08/26/23  SUBJECTIVE:   SUBJECTIVE STATEMENT: Underwent R knee viscosupplementation injections and has some elevated knee pain.  Reports no pain in region of plantar fascia or cuboid bone.  PERTINENT HISTORY: Right plantar fascial tear -Ice, discussed gradual transition back to regular shoe as tolerated.  Discussed icing, stretching exercises.  Referral to [facility] therapy [facility] to [facility].  If her symptoms continue or worsen recommend to go back into the walking boot.   Return in about 6 weeks (around 11/06/2023) for plantar fascia tear, follow up from physical therapy . PAIN:  Are you having pain? Yes: NPRS scale: 5/10 Pain location: R arch Pain description: ache Aggravating factors: weight bearing Relieving factors: rest  PRECAUTIONS: None  RED FLAGS: None   WEIGHT BEARING RESTRICTIONS: No  FALLS:  Has patient fallen in last 6 months? No  OCCUPATION: retired  PLOF: Independent  PATIENT GOALS: To manage my R foot pain  NEXT MD VISIT: 6 weeks  OBJECTIVE:  Note: Objective measures were completed at Evaluation unless otherwise noted.  DIAGNOSTIC FINDINGS: none available  11/12/23 IMPRESSION: Mild edema in the cuboid which may be related to degenerative edema or a stress reaction. There is no displaced fracture identified. Correlation for lateral midfoot/hindfoot pain.   Mild edema in the plantar aspect of the calcaneus with thickening of the medial lateral fascia consistent with plantar fasciitis.   Electronically signed by: Norleen Satchel MD 11/06/2023 11:36 AM EDT RP Workstation:  [id number]  PATIENT SURVEYS:  LEFS 21/80; 12/16/23 41/80   MUSCLE LENGTH: Not tested  POSTURE: No Significant postural limitations  PALPATION: TTP plantar fascia origin  LOWER EXTREMITY ROM:  Active ROM Right eval Left eval R  11/12/23  Hip flexion     Hip extension     Hip abduction     Hip adduction     Hip internal rotation     Hip external rotation     Knee flexion     Knee extension     Ankle dorsiflexion 5d 5d 8/12d  Ankle plantarflexion     Ankle inversion 20d 30d   Ankle eversion 15d 20d    (Blank rows = not tested)  LOWER EXTREMITY MMT:  MMT Right eval Left eval R 01/01/24  Hip flexion     Hip extension     Hip abduction     Hip adduction     Hip internal rotation     Hip external rotation     Knee flexion     Knee extension     Ankle dorsiflexion 4-  4  Ankle plantarflexion 3  4  Ankle inversion 4-  4  Ankle eversion 4-  4   (Blank rows = not tested)  LOWER EXTREMITY SPECIAL TESTS:  deferred  FUNCTIONAL TESTS:  30 seconds chair stand test 7 reps; 10/21/23 8 reps; 01/01/24 17 10/21/23 SLS 13s L, 8s R 11/12/23 SLS 15s L, 13s R; 12/16/23 20s B  12/16/23 2 MWT 410 ft GAIT: Distance walked: 37ft x2 Assistive device utilized: None Level of assistance: Complete Independence Comments: antalgic with decreased step length L                                                                                                                                TREATMENT: [facility] Adult PT Treatment:                                                DATE: 01/01/24 Therapeutic Exercise: R ankle 3 way RTB 15x2 R gastroc soleus stretch on wall 60s each Neuromuscular re-ed: Heel raise from 4 in step 15x B SL heel raise from floor 15/15 SLS rearfoot on airex 30s each Therapeutic Activity: LEFS retake 40/80 Assessment of goal progress, ROM, strength  [facility] Adult PT Treatment:  DATE: 12/25/23 Therapeutic Exercise: Heel raise  off 4 step 15x SL heel raise from floor 15/15 Gastroc/soleus stretch on slant board 60s ea Neuromuscular re-ed: Instruction in t-band inv/ev Therapeutic Activity: Assessment of ROM, mobility and palpation of affected region  [facility] Adult PT Treatment:                                                DATE: 12/16/23 Therapeutic Exercise: Nustep L6 8 min 50 SPM Neuromuscular re-ed: Heel raises from 4 in step Runners step fro 8 in step 10/10 Side stepping against red band 2 trips  Therapeutic Activity: LEFS, 2 MWT, assessment of goal progress  [facility] Adult PT Treatment:                                                DATE: 12/09/23 Therapeutic Exercise: Nustep L6 8 min 45 SPM Neuromuscular re-ed: R soleus strengthening over 4 in step 20# DB 15x2 Side stepping against yellow band 2 trips  Therapeutic Activity: PF Stretch with towel 60s x2 4 way ankle GTB 15x2   PATIENT EDUCATION:  Education details: Discussed eval findings, rehab rationale and POC and patient is in agreement  Person educated: Patient Education method: Explanation and Handouts Education comprehension: verbalized understanding and needs further education  HOME EXERCISE PROGRAM: Access Code: [email or web address] URL: [email or web address] Date: 11/12/2023 Prepared by: Reyes Kohut  Exercises - Long Sitting Plantar Fascia Stretch with Towel  - 2-3 x daily - 5 x weekly - 1 sets - 2 reps - 30s hold - Ankle Inversion Eversion Towel Slide  - 2-3 x daily - 5 x weekly - 2 sets - 15 reps - Single Leg Stance with Support  - 2-3 x daily - 5 x weekly - 2 sets - 2 reps - 15s hold - Standing Heel Raise with Support  - 2-3 x daily - 5 x weekly - 1 sets - 10 reps - Toe Raise With Back Against Wall  - 2-3 x daily - 5 x weekly - 1 sets - 10 reps  ASSESSMENT:  CLINICAL IMPRESSION: Continued discomfort at cuboid region of foot.  Rehab goals met/maximum benefit reached in PT.  Functional perception unchanged since last  assessment.    Patient is a 78 y.o. female who was seen today for physical therapy evaluation and treatment for R plantar fascia tear.  Patient presents with mild ROM and strength deficits in R ankle as well as TTP at origin of plantar fascia.  Pain limits prolonged standing and WB.     OBJECTIVE IMPAIRMENTS: Abnormal gait, decreased activity tolerance, decreased balance, decreased knowledge of condition, decreased mobility, difficulty walking, decreased strength, increased fascial restrictions, improper body mechanics, and pain.   ACTIVITY LIMITATIONS: standing, squatting, and stairs  PERSONAL FACTORS: Age, Fitness, Past/current experiences, and Time since onset of injury/illness/exacerbation are also affecting patient's functional outcome.   REHAB POTENTIAL: Good  CLINICAL DECISION MAKING: Evolving/moderate complexity  EVALUATION COMPLEXITY: Low   GOALS: Goals reviewed with patient? No  SHORT TERM GOALS: Target date: 10/26/2023   Patient to demonstrate independence in HEP  Baseline: [id number] Goal status: Met  2.  238ft ambulation w/o AD Baseline: 56ft w/o AD; 12/16/23 2 MWT 420 ft Goal status: Met  LONG  TERM GOALS: Target date: 01/05/2024    Patient will acknowledge 2/10 pain at least once during episode of care  Baseline: 5/10; 12/16/23 2-3/10; 01/01/24 2/10 Goal status: Met  2.  Patient will score at least 40/80 on LEFS to signify clinically meaningful improvement in functional abilities.   Baseline: 21/80; 12/16/23 41/80  Goal status: Met  3.  Patient will increase 30s chair stand reps from 7 to 10 without arms to demonstrate and improved functional ability with less pain/difficulty as well as reduce fall risk. Baseline: 7; 10/21/23 8; 12/16/23 9; 01/01/24 17 Goal status: Met  4.  Increase R ankle strength to 4/5 Baseline:  MMT Right eval Left eval R 01/01/24  Hip flexion     Hip extension     Hip abduction     Hip adduction     Hip internal rotation     Hip external  rotation     Knee flexion     Knee extension     Ankle dorsiflexion 4-  4  Ankle plantarflexion 3  4  Ankle inversion 4-  4  Ankle eversion 4-  4   Goal status: Met  5.  Increase AROM DF to 8d Baseline:  Active ROM Right eval Left eval R  11/12/23  Hip flexion     Hip extension     Hip abduction     Hip adduction     Hip internal rotation     Hip external rotation     Knee flexion     Knee extension     Ankle dorsiflexion 5d 5d 8/12d  Ankle plantarflexion     Ankle inversion 20d 30d   Ankle eversion 15d 20d    Goal status: Met     PLAN:  PT FREQUENCY: 1-2x/week  PT DURATION: 6 weeks  PLANNED INTERVENTIONS: 97110-Therapeutic exercises, 97530- Therapeutic activity, 97112- Neuromuscular re-education, 97535- Self Care, 02859- Manual therapy, [phone]- Gait training, Balance training, Stair training, and Joint mobilization  PLAN FOR NEXT SESSION: HEP review and update, manual techniques as appropriate, aerobic tasks, ROM and flexibility activities, strengthening and PREs, TPDN, gait and balance training as needed   Jeff Shannara Winbush PT  01/01/2024, 10:43 AM

## 2024-01-03 NOTE — Progress Notes (Signed)
  Subjective:  Patient ID: Lindsay Peck, female    DOB: 12-12-1945,  MRN: [id number]  Chief Complaint  Patient presents with   Follow-up    3rd EPAT Treatment follow up. Makes heels feel warm 65% better. Non diabetic.2 pain walking. Wearing plantar fascia brace.    78 y.o. female presents with the above complaint.  She states she has completed physical therapy she has reached her maximum medical improvement.  She does feel that shockwave has been helpful but overall she is discouraged with continued pain and she was sleep abnormal walking without any discomfort.    Objective:  General: AAO x3, NAD-present with son  Dermatological: No open lesions identified.  Vascular: Dorsalis Pedis artery and Posterior Tibial artery pedal pulses are 2/4 bilateral with immedate capillary fill time.  There is no pain with calf compression, swelling, warmth, erythema.   Neruologic: Grossly intact via light touch bilateral.   Musculoskeletal: She has continued discomfort on the plantar aspect of calcaneus on insertion of plantar fascia as well as in the arch of the foot on the plantar fascia.  This is mostly along the medial band but she also has some discomfort lateral aspect.  She does get discomfort along the cuboid area laterally as well, however this appears to be much less today.  There is no area of pinpoint tenderness.  Gait: Unassisted, Nonantalgic.    Assessment:   Plantar fasciitis, bone contusion  Plan:  Patient was evaluated and treated and all questions answered.  -He does not shop with his been helpful so arrange another treatment of the schedule for Tuesday.  Also she is can we go on vacation plan another treatment afterwards.  Still continue home therapy exercises, shoes, good arch supports as well as medications to help with the discomfort.  Discussed ongoing treatment on a regular basis.  Will hold off on another injection today.  Donnice JONELLE Fees DPM

## 2024-01-05 ENCOUNTER — Ambulatory Visit (INDEPENDENT_AMBULATORY_CARE_PROVIDER_SITE_OTHER)

## 2024-01-05 DIAGNOSIS — M722 Plantar fascial fibromatosis: Secondary | ICD-10-CM

## 2024-01-05 NOTE — Progress Notes (Signed)
 Patient presents for the 4th [facility] EPAT treatment today with complaint of right heel pain. Diagnosed with plantar fascitis by Dr. Gershon. This has been ongoing for several months. The patient has tried ice, stretching, NSAIDS and supportive shoe gear with no long term relief. Patient was recently seen by Dr. Gershon and reported improvement in symptoms with treatments. Per Dr. Gershon patient can do a couple more treatments if still helping at 2 week intervals.   Most of the pain is located in the right arch and heel .  ESWT administered and tolerated well.Treatment settings initiated at:   Energy: 60  Ended treatment session today with 2500 shocks at the following settings:   Energy: 70  Frequency: 10 hz   Reviewed post EPAT instructions. Advised to avoid ice and NSAIDs throughout the treatment process and to utilize boot or supportive shoes for at least the next 3 days.  Follow up for 5th treatment in when you return from your trip to [place].

## 2024-01-26 ENCOUNTER — Ambulatory Visit (INDEPENDENT_AMBULATORY_CARE_PROVIDER_SITE_OTHER): Payer: Self-pay

## 2024-01-26 DIAGNOSIS — M722 Plantar fascial fibromatosis: Secondary | ICD-10-CM

## 2024-01-26 NOTE — Progress Notes (Signed)
 Patient presents for the 5th [facility] treatment today with complaint of right heel pain. Diagnosed with plantar fascitis by Dr. Gershon. This has been ongoing for several months. The patient has tried ice, stretching, NSAIDS and supportive shoe gear with no long term relief. Dr. Gershon spoke briefly with patient as she was leaving today. Instructed patient to continue with stretches and wearing supportive shoes as well as other previous recommendations given by provider. Advised that if pain begins to flare up again she could call back to get another treatment. Patient verbalized understanding.   Most of the pain is located right heel, medial and lateral side as well as bottom of heel .  ESWT administered and tolerated well.Treatment settings initiated at:   Energy: 70  Ended treatment session today with 2500 shocks at the following settings:   Energy: 70  Frequency: 10 hz   Reviewed post EPAT instructions. Advised to avoid ice and NSAIDs throughout the treatment process and to utilize boot or supportive shoes for at least the next 3 days.  Follow up for as needed for treatment.

## 2024-02-08 ENCOUNTER — Ambulatory Visit: Admitting: Podiatry

## 2024-02-11 DIAGNOSIS — M8589 Other specified disorders of bone density and structure, multiple sites: Secondary | ICD-10-CM | POA: Diagnosis not present

## 2024-02-11 DIAGNOSIS — Z1231 Encounter for screening mammogram for malignant neoplasm of breast: Secondary | ICD-10-CM | POA: Diagnosis not present

## 2024-02-12 ENCOUNTER — Ambulatory Visit (INDEPENDENT_AMBULATORY_CARE_PROVIDER_SITE_OTHER)

## 2024-02-12 DIAGNOSIS — M722 Plantar fascial fibromatosis: Secondary | ICD-10-CM

## 2024-02-12 NOTE — Progress Notes (Signed)
 Patient presents for the 6th [facility] treatment today with complaint of right heel pain. Diagnosed with plantar fascitis by Dr. Gershon. This has been ongoing for several months. The patient has tried ice, stretching, NSAIDS and supportive shoe gear with no long term relief.   Most of the pain is located right heel. Patient also has complaints of the heel feeling warm after walking on it for awhile. She does plan on seeing Dr. Gershon in a couple of weeks.   ESWT administered and tolerated well.Treatment settings initiated at:   Energy: 70  Ended treatment session today with 2500 shocks at the following settings:   Energy: 70  Frequency: 10    Reviewed post [facility] instructions. Advised to avoid ice and NSAIDs throughout the treatment process and to utilize boot or supportive shoes for at least the next 3 days.  Follow up with Dr. Gershon in 2 weeks.

## 2024-02-18 ENCOUNTER — Ambulatory Visit (INDEPENDENT_AMBULATORY_CARE_PROVIDER_SITE_OTHER)

## 2024-02-18 ENCOUNTER — Ambulatory Visit: Admitting: Podiatry

## 2024-02-18 ENCOUNTER — Encounter: Payer: Self-pay | Admitting: Podiatry

## 2024-02-18 VITALS — Ht 60.0 in | Wt 171.0 lb

## 2024-02-18 DIAGNOSIS — M7731 Calcaneal spur, right foot: Secondary | ICD-10-CM

## 2024-02-18 DIAGNOSIS — M722 Plantar fascial fibromatosis: Secondary | ICD-10-CM | POA: Diagnosis not present

## 2024-02-18 MED ORDER — MELOXICAM 7.5 MG PO TABS: 7.5000 mg | ORAL_TABLET | Freq: Every day | ORAL | 0 refills | Status: AC | PRN

## 2024-02-18 NOTE — Progress Notes (Signed)
  Subjective:  Patient ID: Lindsay Peck, female    DOB: 04/10/1946,  MRN: [id number] Chief Complaint  Patient presents with   Plantar Fasciitis    Pt is here to f/u on right foot due to plantar fasciitis, she has went thru 6 [facility] treatments, she states that the problem has not changed, has a list of questions. No other concerns.     78 y.o. female presents with the above complaint.  She states that she has multiple questions and concerns today that she would like to discuss.  Originally when she started the shockwave treatment this was beneficial with the last couple treatments has not been as helpful.  Actually after the last treatment when she walked out she states that she may not come back because her foot was feeling better but then it started causing increasing discomfort as she is dragging back to normal activities.  She is concerned about a heel spur.  She does not recall any recent injuries since I saw her last.  She is concerned was made with her big toenails that they are starting to curl up in warts.    Objective:  General: AAO x3, NAD  Dermatological: No open lesions identified.  Ingrown toenails bilateral hallux toenails nails are mildly hypertrophic, dystrophic.  Vascular: Dorsalis Pedis artery and Posterior Tibial artery pedal pulses are 2/4 bilateral with immedate capillary fill time.  There is no pain with calf compression, swelling, warmth, erythema.   Neruologic: Grossly intact via light touch bilateral.  Negative Tinel sign.  Musculoskeletal: She has continued discomfort on the plantar aspect of calcaneus on insertion of plantar fascia as well as in the arch of the foot on the plantar fascia.  There is no tenderness on the cuboid today.  There is no area of pinpoint tenderness.  There is no edema, erythema.  There is no area of pinpoint tenderness. MMT 5/5.   Gait: On gait evaluation she has more of a stepping type gait and symmetrical bilaterally.     Assessment:   Plantar fasciitis, bone contusion; ingrown toenails  Plan:  Patient was evaluated and treated and all questions answered.  - We had a extensive conversation today in regards to her symptoms as well as etiology.  We discussed multiple different treatment options.  We discussed the difference between heel spurs, plantar fasciitis but ultimately the treatment are about the same.  Ordered a modified and steroid injection today.  She is meloxicam  to help as this was helpful previously.  Discussed icing daily.  We discussed continued stretching, rehab exercises and the new referral for [facility] was placed today.  We discussed continue shoes, good arch support.  Night splint was also dispensed. Static or dynamic ankle foot orthosis, including soft interface material, adjustable for fit, for positioning, may be used for minimal ambulation, prefabricated, off-the-shelf was dispensed on the billed date of service.  We previously discussed surgical intervention but mutually agreed to hold off on this today. -Today I debrided the corners of bilateral hallux nails and complications of bleeding.  Consider partial nail avulsion if needed.  40 minutes were spent with the patient  Return in about 4 weeks (around 03/17/2024).  Donnice JONELLE Fees DPM

## 2024-02-29 DIAGNOSIS — R2689 Other abnormalities of gait and mobility: Secondary | ICD-10-CM | POA: Diagnosis not present

## 2024-02-29 DIAGNOSIS — M79671 Pain in right foot: Secondary | ICD-10-CM | POA: Diagnosis not present

## 2024-02-29 DIAGNOSIS — M6281 Muscle weakness (generalized): Secondary | ICD-10-CM | POA: Diagnosis not present

## 2024-03-02 DIAGNOSIS — R2689 Other abnormalities of gait and mobility: Secondary | ICD-10-CM | POA: Diagnosis not present

## 2024-03-02 DIAGNOSIS — M6281 Muscle weakness (generalized): Secondary | ICD-10-CM | POA: Diagnosis not present

## 2024-03-02 DIAGNOSIS — M79671 Pain in right foot: Secondary | ICD-10-CM | POA: Diagnosis not present

## 2024-03-11 DIAGNOSIS — R2689 Other abnormalities of gait and mobility: Secondary | ICD-10-CM | POA: Diagnosis not present

## 2024-03-11 DIAGNOSIS — M79671 Pain in right foot: Secondary | ICD-10-CM | POA: Diagnosis not present

## 2024-03-11 DIAGNOSIS — M6281 Muscle weakness (generalized): Secondary | ICD-10-CM | POA: Diagnosis not present

## 2024-03-15 DIAGNOSIS — M6281 Muscle weakness (generalized): Secondary | ICD-10-CM | POA: Diagnosis not present

## 2024-03-15 DIAGNOSIS — M79671 Pain in right foot: Secondary | ICD-10-CM | POA: Diagnosis not present

## 2024-03-15 DIAGNOSIS — R2689 Other abnormalities of gait and mobility: Secondary | ICD-10-CM | POA: Diagnosis not present

## 2024-03-17 ENCOUNTER — Encounter: Payer: Self-pay | Admitting: Podiatry

## 2024-03-17 ENCOUNTER — Ambulatory Visit: Admitting: Podiatry

## 2024-03-17 VITALS — Ht 60.0 in | Wt 171.0 lb

## 2024-03-17 DIAGNOSIS — T148XXA Other injury of unspecified body region, initial encounter: Secondary | ICD-10-CM

## 2024-03-17 DIAGNOSIS — R2689 Other abnormalities of gait and mobility: Secondary | ICD-10-CM | POA: Diagnosis not present

## 2024-03-17 DIAGNOSIS — M722 Plantar fascial fibromatosis: Secondary | ICD-10-CM | POA: Diagnosis not present

## 2024-03-17 DIAGNOSIS — M79671 Pain in right foot: Secondary | ICD-10-CM | POA: Diagnosis not present

## 2024-03-17 DIAGNOSIS — M6281 Muscle weakness (generalized): Secondary | ICD-10-CM | POA: Diagnosis not present

## 2024-03-17 NOTE — Progress Notes (Signed)
  Subjective:  Patient ID: Lindsay Peck, female    DOB: 1946-04-14,  MRN: [id number] Chief Complaint  Patient presents with   Plantar Fasciitis    Pt is here to f/u on bilateral foot pain she state PT is going well.      78 y.o. female presents with the above complaint.  She states that she is doing better and overall her symptoms are improving.  The physical therapy is been helping.  She does not report any recent injuries or changes otherwise and she has no new concerns today.    Objective:  General: AAO x3, NAD  Dermatological: No open lesions identified.  Ingrown toenails bilateral hallux toenails nails are mildly hypertrophic, dystrophic.  Vascular: Dorsalis Pedis artery and Posterior Tibial artery pedal pulses are 2/4 bilateral with immedate capillary fill time.  There is no pain with calf compression, swelling, warmth, erythema.   Neruologic: Grossly intact via light touch bilateral.  Negative Tinel sign.  Musculoskeletal: She has still some mild discomfort on the plantar aspect of calcaneus on insertion of plantar fascia as well as in the arch of the foot on the plantar fascia.  There is no tenderness on the cuboid today.  There is no area of pinpoint tenderness.  There is no edema, erythema.  There is no area of pinpoint tenderness. MMT 5/5.   Assessment:   Plantar fasciitis, bone contusion  Plan:  Patient was evaluated and treated and all questions answered.  Lindsay Peck again reviewed symptoms as well as treatment plan.  She seems to making progress with physical therapy and she is happy to continue with this treatment.  Continue physical therapy and continue to work on shoes, good arch supports.  As her symptoms are improving I do expect this to continue to improve although this can take some time.  As time she agrees with this plan I will also see her back on an as needed basis but there is any changes or any worsening or no improvement to let me know.  Return if symptoms  worsen or fail to improve.  Lindsay Peck DPM

## 2024-03-21 DIAGNOSIS — M79671 Pain in right foot: Secondary | ICD-10-CM | POA: Diagnosis not present

## 2024-03-21 DIAGNOSIS — M6281 Muscle weakness (generalized): Secondary | ICD-10-CM | POA: Diagnosis not present

## 2024-03-21 DIAGNOSIS — R2689 Other abnormalities of gait and mobility: Secondary | ICD-10-CM | POA: Diagnosis not present

## 2024-03-23 DIAGNOSIS — M6281 Muscle weakness (generalized): Secondary | ICD-10-CM | POA: Diagnosis not present

## 2024-03-23 DIAGNOSIS — M79671 Pain in right foot: Secondary | ICD-10-CM | POA: Diagnosis not present

## 2024-03-23 DIAGNOSIS — R2689 Other abnormalities of gait and mobility: Secondary | ICD-10-CM | POA: Diagnosis not present

## 2024-03-30 DIAGNOSIS — M6281 Muscle weakness (generalized): Secondary | ICD-10-CM | POA: Diagnosis not present

## 2024-03-30 DIAGNOSIS — M79671 Pain in right foot: Secondary | ICD-10-CM | POA: Diagnosis not present

## 2024-03-30 DIAGNOSIS — R2689 Other abnormalities of gait and mobility: Secondary | ICD-10-CM | POA: Diagnosis not present

## 2024-04-01 DIAGNOSIS — R2689 Other abnormalities of gait and mobility: Secondary | ICD-10-CM | POA: Diagnosis not present

## 2024-04-01 DIAGNOSIS — M6281 Muscle weakness (generalized): Secondary | ICD-10-CM | POA: Diagnosis not present

## 2024-04-01 DIAGNOSIS — M79671 Pain in right foot: Secondary | ICD-10-CM | POA: Diagnosis not present

## 2024-04-06 DIAGNOSIS — M6281 Muscle weakness (generalized): Secondary | ICD-10-CM | POA: Diagnosis not present

## 2024-04-06 DIAGNOSIS — R2689 Other abnormalities of gait and mobility: Secondary | ICD-10-CM | POA: Diagnosis not present

## 2024-04-06 DIAGNOSIS — M79671 Pain in right foot: Secondary | ICD-10-CM | POA: Diagnosis not present

## 2024-04-13 DIAGNOSIS — M6281 Muscle weakness (generalized): Secondary | ICD-10-CM | POA: Diagnosis not present

## 2024-04-13 DIAGNOSIS — M79671 Pain in right foot: Secondary | ICD-10-CM | POA: Diagnosis not present

## 2024-04-13 DIAGNOSIS — R2689 Other abnormalities of gait and mobility: Secondary | ICD-10-CM | POA: Diagnosis not present

## 2024-04-15 DIAGNOSIS — M6281 Muscle weakness (generalized): Secondary | ICD-10-CM | POA: Diagnosis not present

## 2024-04-15 DIAGNOSIS — M79671 Pain in right foot: Secondary | ICD-10-CM | POA: Diagnosis not present

## 2024-04-15 DIAGNOSIS — R2689 Other abnormalities of gait and mobility: Secondary | ICD-10-CM | POA: Diagnosis not present

## 2024-06-09 ENCOUNTER — Ambulatory Visit: Admitting: Podiatry
